# Patient Record
Sex: Female | Born: 1975 | Race: White | Hispanic: No | Marital: Married | Smoking: Current every day smoker
Health system: Southern US, Community
[De-identification: ages and names within clinical notes are randomized; demographics above are authoritative.]

## PROBLEM LIST (undated history)

## (undated) DIAGNOSIS — N926 Irregular menstruation, unspecified: Secondary | ICD-10-CM

## (undated) DIAGNOSIS — D251 Intramural leiomyoma of uterus: Secondary | ICD-10-CM

## (undated) DIAGNOSIS — S73191A Other sprain of right hip, initial encounter: Secondary | ICD-10-CM

## (undated) DIAGNOSIS — F419 Anxiety disorder, unspecified: Secondary | ICD-10-CM

## (undated) DIAGNOSIS — M1611 Unilateral primary osteoarthritis, right hip: Secondary | ICD-10-CM

## (undated) DIAGNOSIS — M1990 Unspecified osteoarthritis, unspecified site: Secondary | ICD-10-CM

## (undated) DIAGNOSIS — J411 Mucopurulent chronic bronchitis: Secondary | ICD-10-CM

## (undated) DIAGNOSIS — R911 Solitary pulmonary nodule: Secondary | ICD-10-CM

## (undated) DIAGNOSIS — D47Z2 Castleman disease: Secondary | ICD-10-CM

## (undated) DIAGNOSIS — G609 Hereditary and idiopathic neuropathy, unspecified: Secondary | ICD-10-CM

## (undated) DIAGNOSIS — J4489 Other specified chronic obstructive pulmonary disease: Secondary | ICD-10-CM

## (undated) DIAGNOSIS — G709 Myoneural disorder, unspecified: Secondary | ICD-10-CM

## (undated) HISTORY — PX: LYMPH NODE BIOPSY: SHX201

## (undated) HISTORY — PX: LYMPH GLAND EXCISION: SHX13

## (undated) HISTORY — PX: TUBAL LIGATION: SHX77

## (undated) HISTORY — PX: TYMPANOSTOMY TUBE PLACEMENT: SHX32

---
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
## 2017-03-27 ENCOUNTER — Emergency Department
Admission: EM | Admit: 2017-03-27 | Discharge: 2017-03-27 | Disposition: A | Discharge status: Home / Self Care | Payer: BLUE CROSS/BLUE SHIELD | Attending: Emergency Medicine | Admitting: Emergency Medicine

## 2017-03-27 ENCOUNTER — Encounter: Payer: Self-pay | Admitting: Emergency Medicine

## 2017-03-27 ENCOUNTER — Emergency Department: Payer: BLUE CROSS/BLUE SHIELD

## 2017-03-27 DIAGNOSIS — F17200 Nicotine dependence, unspecified, uncomplicated: Secondary | ICD-10-CM | POA: Insufficient documentation

## 2017-03-27 DIAGNOSIS — R079 Chest pain, unspecified: Secondary | ICD-10-CM | POA: Diagnosis not present

## 2017-03-27 HISTORY — DX: Castleman disease: D47.Z2

## 2017-03-27 LAB — CBC
HEMATOCRIT: 39.5 % (ref 35.0–47.0)
HEMOGLOBIN: 13.6 g/dL (ref 12.0–16.0)
MCH: 30.4 pg (ref 26.0–34.0)
MCHC: 34.4 g/dL (ref 32.0–36.0)
MCV: 88.4 fL (ref 80.0–100.0)
Platelets: 301 10*3/uL (ref 150–440)
RBC: 4.47 MIL/uL (ref 3.80–5.20)
RDW: 13.9 % (ref 11.5–14.5)
WBC: 10.1 10*3/uL (ref 3.6–11.0)

## 2017-03-27 LAB — BASIC METABOLIC PANEL
ANION GAP: 7 (ref 5–15)
BUN: 7 mg/dL (ref 6–20)
CALCIUM: 8.6 mg/dL — AB (ref 8.9–10.3)
CO2: 23 mmol/L (ref 22–32)
Chloride: 107 mmol/L (ref 101–111)
Creatinine, Ser: 0.62 mg/dL (ref 0.44–1.00)
GFR calc non Af Amer: >60 mL/min (ref >60)
Glucose, Bld: 91 mg/dL (ref 65–99)
POTASSIUM: 3.9 mmol/L (ref 3.5–5.1)
Sodium: 137 mmol/L (ref 135–145)

## 2017-03-27 LAB — TROPONIN I

## 2017-03-27 MED ORDER — TRAMADOL HCL 50 MG PO TABS: 50.0000 mg | ORAL_TABLET | Freq: Four times a day (QID) | ORAL | 0 refills | Status: DC | PRN

## 2017-03-27 NOTE — ED Provider Notes (Signed)
[facility] Emergency Department Provider Note  Time seen: 5:43 PM  I have reviewed the triage vital signs and the nursing notes.   HISTORY  Chief Complaint Chest Pain    HPI Brooke Molina is a 41 y.o. female With no significant past medical history presents to the emergency department for chest pain/back pain. According to the patient for the past 3 days she has been experiencing left scapula pain. States the pain is worse with movement. Today she felt like the pain went into her left chest which concerned her. States she was having some mild shortness of breath at times, but denies any shortness of breath currently. Denies any diaphoresis or nausea at any point. Patient states the pain is gone at this time but only comes intermittently especially with movement. Denies any leg pain or swelling.  Past Medical History:  Diagnosis Date  . 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   Past Surgical History:  Procedure Laterality Date  . TUBAL LIGATION      Prior to Admission medications   Not on File    No Known Allergies  History reviewed. No pertinent family history.  Social History Social History  Substance Use Topics  . Smoking status: Current Every Day Smoker  . Smokeless tobacco: Never Used  . Alcohol use No    Review of Systems Constitutional: Negative for fever. Cardiovascular: left chest pain, now resolved Respiratory: mild shortness breath earlier, now resolved Gastrointestinal: Negative for abdominal pain, vomiting and diarrhea. Musculoskeletal: negative for leg pain or swelling All other ROS negative  ____________________________________________   PHYSICAL EXAM:  VITAL SIGNS: ED Triage Vitals [03/27/17 1633]  Enc Vitals Group     BP (!) 145/79     Pulse Rate 84     Resp 18     Temp 98.4 F (36.9 C)     Temp Source Oral     SpO2 99 %     Weight 235 lb (106.6 kg)   Height 5\' 10"  (1.778 m)     Head Circumference      Peak Flow      Pain Score 4     Pain Loc      Pain Edu?      Excl. in [place]?     Constitutional: Alert and oriented. Well appearing and in no distress. Eyes: Normal exam ENT   Head: Normocephalic and atraumatic.   Mouth/Throat: Mucous membranes are moist. Cardiovascular: Normal rate, regular rhythm. No murmur Respiratory: Normal respiratory effort without tachypnea nor retractions. Breath sounds are clear. No chest or back tenderness to palpation. Gastrointestinal: Soft and nontender. No distention. Musculoskeletal: Nontender with normal range of motion in all extremities. No lower extremity tenderness or edema. Neurologic:  Normal speech and language. No gross focal neurologic deficits Skin:  Skin is warm, dry and intact.  Psychiatric: Mood and affect are normal.   ____________________________________________    EKG  EKG reviewed and interpreted by myself shows normal sinus rhythm at 65 bpm, narrow QRS, normal axis, normal intervals, no concerning ST changes. Normal EKG.  ____________________________________________    RADIOLOGY  chest x-ray is normal  ____________________________________________   INITIAL IMPRESSION / ASSESSMENT AND PLAN / ED COURSE  Pertinent labs & imaging results that were available during my care of the patient were reviewed by me and considered in my medical decision making (see chart for details).  patient presents the emergency department for left back  pain as well as left chest pain. States the back pain has been ongoing for several days but today was the first day that involved the chest. Currently she denies any discomfort or pain at this time. But states the pain is intermittent. Denies any pleuritic nature to the chest pain. At this time differential would be fairly broad but would include musculoskeletal pain, pleurisy, ACS, PE. Patient's labs are within normal limits including negative  troponin. Chest x-ray is normal. Patient is perc negative.  overall she appears very well. We will monitor in the emergency department. As long as the patient continues to appear as well we will discharge home with PCP follow-up.  patient's labs are negative including negative troponin. Chest x-ray is normal. X-rays reassuring. Patient continues to appear very well with no further symptoms in the emergency department. We will discharge with a short course of tramadol. I recommended PCP follow-up we will refer to [facility] clinic. Patient is agreeable to this plan. Provided my normal chest pain return precautions. ____________________________________________   FINAL CLINICAL IMPRESSION(S) / ED DIAGNOSES  chest pain    Harvest Dark, MD 03/27/17 [phone]

## 2017-03-27 NOTE — Discharge Instructions (Signed)
You have been seen in the emergency department today for chest pain. Your workup has shown normal results. As we discussed please follow-up with your primary care physician in the next 1-2 days for recheck. Return to the emergency department for any further chest pain, trouble breathing, or any other symptom personally concerning to yourself. °

## 2017-03-27 NOTE — ED Triage Notes (Signed)
Pt c/o pain under left shoulder blade that radiates around to chest. Has been intermittent since Saturday.  Hard to take deep breath at times but denies [facility] or other symptoms.  Skin warm and dry. respiration unlabored.  VSS

## 2017-10-02 ENCOUNTER — Inpatient Hospital Stay: Payer: BLUE CROSS/BLUE SHIELD | Attending: Oncology | Admitting: Oncology

## 2017-10-02 ENCOUNTER — Inpatient Hospital Stay: Payer: BLUE CROSS/BLUE SHIELD

## 2017-10-02 ENCOUNTER — Other Ambulatory Visit: Payer: Self-pay

## 2017-10-02 ENCOUNTER — Encounter: Payer: Self-pay | Admitting: Oncology

## 2017-10-02 VITALS — BP 143/88 | HR 73 | Temp 97.7°F | Wt 225.1 lb

## 2017-10-02 DIAGNOSIS — Z720 Tobacco use: Secondary | ICD-10-CM | POA: Insufficient documentation

## 2017-10-02 DIAGNOSIS — D47Z2 Castleman disease: Secondary | ICD-10-CM

## 2017-10-02 DIAGNOSIS — R634 Abnormal weight loss: Secondary | ICD-10-CM | POA: Insufficient documentation

## 2017-10-02 LAB — COMPREHENSIVE METABOLIC PANEL
ALBUMIN: 4.1 g/dL (ref 3.5–5.0)
ALT: 13 U/L — ABNORMAL LOW (ref 14–54)
ANION GAP: 7 (ref 5–15)
AST: 15 U/L (ref 15–41)
Alkaline Phosphatase: 61 U/L (ref 38–126)
BILIRUBIN TOTAL: 0.9 mg/dL (ref 0.3–1.2)
BUN: 14 mg/dL (ref 6–20)
CHLORIDE: 109 mmol/L (ref 101–111)
CO2: 21 mmol/L — ABNORMAL LOW (ref 22–32)
Calcium: 8.7 mg/dL — ABNORMAL LOW (ref 8.9–10.3)
Creatinine, Ser: 0.67 mg/dL (ref 0.44–1.00)
GFR calc Af Amer: >60 mL/min (ref >60)
GFR calc non Af Amer: >60 mL/min (ref >60)
GLUCOSE: 92 mg/dL (ref 65–99)
POTASSIUM: 3.9 mmol/L (ref 3.5–5.1)
Sodium: 137 mmol/L (ref 135–145)
Total Protein: 7.1 g/dL (ref 6.5–8.1)

## 2017-10-02 LAB — CBC WITH DIFFERENTIAL/PLATELET
BASOS ABS: 0.1 10*3/uL (ref 0–0.1)
Basophils Relative: 1 %
Eosinophils Absolute: 0.2 10*3/uL (ref 0–0.7)
Eosinophils Relative: 2 %
HEMATOCRIT: 41.8 % (ref 35.0–47.0)
Hemoglobin: 14.3 g/dL (ref 12.0–16.0)
LYMPHS ABS: 2.1 10*3/uL (ref 1.0–3.6)
LYMPHS PCT: 27 %
MCH: 30.7 pg (ref 26.0–34.0)
MCHC: 34.3 g/dL (ref 32.0–36.0)
MCV: 89.6 fL (ref 80.0–100.0)
MONO ABS: 0.4 10*3/uL (ref 0.2–0.9)
Monocytes Relative: 5 %
NEUTROS ABS: 5.2 10*3/uL (ref 1.4–6.5)
Neutrophils Relative %: 65 %
Platelets: 290 10*3/uL (ref 150–440)
RBC: 4.66 MIL/uL (ref 3.80–5.20)
RDW: 13.8 % (ref 11.5–14.5)
WBC: 8 10*3/uL (ref 3.6–11.0)

## 2017-10-02 LAB — SEDIMENTATION RATE: Sed Rate: 5 mm/hr (ref 0–20)

## 2017-10-02 LAB — FERRITIN: Ferritin: 37 ng/mL (ref 11–307)

## 2017-10-02 LAB — C-REACTIVE PROTEIN

## 2017-10-02 LAB — LACTATE DEHYDROGENASE: LDH: 119 U/L (ref 98–192)

## 2017-10-02 NOTE — Progress Notes (Signed)
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   HISTORY OF PRESENTING ILLNESS:  Brooke Molina is a  42 y.o.  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 She reports have left cervical lymphadenopathy associated with weight loss. 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  Patient reports that she was seen by hematologist for 2 times with 6 months interval, and had a few CT surveillance prior to moving to [place] in 2014 [place] 2015.  Since then she has lost follow-up.  She becomes concerned recently as she has been losing weight despite eating well. Review of Systems  Constitutional: Positive for weight loss. Negative for chills, fever and malaise/fatigue.  HENT: Negative for ear discharge and hearing loss.   Eyes: Negative for double vision and photophobia.  Respiratory: Negative for cough and hemoptysis.   Cardiovascular: Negative for chest pain and orthopnea.  Gastrointestinal: Negative for abdominal pain, heartburn, nausea and vomiting.  Genitourinary: Negative for dysuria.  Musculoskeletal: Negative for myalgias.  Skin: Negative for rash.  Neurological: Negative for dizziness and tingling.  Endo/Heme/Allergies: Does not bruise/bleed easily.  Psychiatric/Behavioral: Negative for depression and substance abuse.    MEDICAL HISTORY:  Past Medical History:  Diagnosis Date  . 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 TUBAL LIGATION      SOCIAL HISTORY: Social History   Socioeconomic History  . Marital status: Married    Spouse name: Not on file  .  Number of children: Not on file  . Years of education: Not on file  . Highest education level: Not on file  Occupational History  . Not on file  Social Needs  . Financial resource strain: Not on file  . Food insecurity:    Worry: Not on file    Inability: Not on file  . Transportation needs:    Medical: Not on file    Non-medical: Not on file  Tobacco Use  . Smoking status: Current Every Day Smoker    Packs/day: 1.00    Years: 24.00    Pack years: 24.00    Types: Cigarettes  . Smokeless tobacco: Never Used  Substance and Sexual Activity  . Alcohol use: No  . Drug use: No  . Sexual activity: Not on file  Lifestyle  . Physical activity:    Days per week: Not on file    Minutes per session: Not on file  . Stress: Not on file  Relationships  . Social connections:    Talks on phone: Not on file    Gets together: Not on file    Attends religious service: Not on file    Active member of club or organization: Not on file    Attends meetings of clubs or organizations: Not on file    Relationship status: Not on file  . Intimate partner violence:    Fear of current or ex partner: Not on file    Emotionally abused: Not on file    Physically abused: Not on file    Forced sexual activity: Not on file  Other Topics Concern  . Not on file  Social History Narrative  . Not on file    FAMILY HISTORY:  History reviewed. No pertinent family history.  ALLERGIES:  has No Known Allergies.  MEDICATIONS:  Current Outpatient Medications  Medication Sig Dispense Refill  . naproxen sodium (ALEVE) 220 MG tablet Take by mouth.     No current facility-administered medications for this visit.      PHYSICAL EXAMINATION: ECOG PERFORMANCE STATUS: 1 - Symptomatic but completely ambulatory Vitals:   10/02/17 0909 10/02/17 0934  BP: (!) 143/88 (!) 143/88  Pulse: 73 73  Temp: 97.7 F (36.5 C) 97.7 F (36.5 C)   Filed Weights   10/02/17 0909 10/02/17 0934  Weight: 225 lb 1.6 oz (102.1  kg) 225 lb 1.6 oz (102.1 kg)    Physical Exam  Constitutional: She is oriented to person, place, and time and well-developed, well-nourished, and in no distress. No distress.  HENT:  Head: Normocephalic and atraumatic.  Mouth/Throat: No oropharyngeal exudate.  Eyes: Pupils are equal, round, and reactive to light. Conjunctivae and EOM are normal. No scleral icterus.  Neck: Normal range of motion. Neck supple.  A very faint scar from previous excisional lymph node biopsy  Cardiovascular: Normal rate and regular rhythm.  No murmur heard. Pulmonary/Chest: Effort normal and breath sounds normal.  Abdominal: Soft. Bowel sounds are normal.  Musculoskeletal: Normal range of motion. She exhibits no edema.  Lymphadenopathy:    She has no cervical adenopathy.  Neurological: She is alert and oriented to person, place, and time. No cranial nerve deficit.  Skin: Skin is warm.  Psychiatric: Affect and judgment normal.     LABORATORY DATA:  I have reviewed the data as listed Lab Results  Component Value Date   WBC 8.0 10/02/2017   HGB 14.3 10/02/2017   HCT 41.8 10/02/2017   MCV 89.6 10/02/2017   PLT 290 10/02/2017   Recent Labs    03/27/17 1637 10/02/17 1105  NA 137 137  K 3.9 3.9  CL 107 109  CO2 23 21*  GLUCOSE 91 92  BUN 7 14  CREATININE 0.62 0.67  CALCIUM 8.6* 8.7*  GFRNONAA >60 >60  GFRAA >60 >60  PROT  --  7.1  ALBUMIN  --  4.1  AST  --  15  ALT  --  13*  ALKPHOS  --  61  BILITOT  --  0.9       ASSESSMENT & PLAN:  1. Castleman disease [facility])    Discussed with patient that we will need to obtain medical records and decide what additional work up she needs at this point. Patient voices understanding.  Check baseline CBC and CMP.   All questions were answered. The patient knows to call the clinic with any problems questions or concerns.  Return of visit: 2 weeks.  Thank you for this kind referral and the opportunity to participate in the care of this patient. A  copy of today's note is routed to referring provider    Earlie Server, MD, PhD Hematology Oncology [facility] at [facility] Pager- [phone] 10/02/2017

## 2017-10-03 LAB — PROTEIN ELECTROPHORESIS, SERUM
A/G Ratio: 1.3 (ref 0.7–1.7)
ALBUMIN ELP: 3.8 g/dL (ref 2.9–4.4)
ALPHA-1-GLOBULIN: 0.2 g/dL (ref 0.0–0.4)
Alpha-2-Globulin: 0.7 g/dL (ref 0.4–1.0)
BETA GLOBULIN: 0.9 g/dL (ref 0.7–1.3)
Gamma Globulin: 1.1 g/dL (ref 0.4–1.8)
Globulin, Total: 2.9 g/dL (ref 2.2–3.9)
TOTAL PROTEIN ELP: 6.7 g/dL (ref 6.0–8.5)

## 2017-10-03 LAB — HIV ANTIBODY (ROUTINE TESTING W REFLEX): HIV Screen 4th Generation wRfx: NONREACTIVE

## 2017-10-03 LAB — HEPATITIS PANEL, ACUTE
HEP A IGM: NEGATIVE
Hep B C IgM: NEGATIVE
Hepatitis B Surface Ag: NEGATIVE

## 2017-10-04 LAB — COMP PANEL: LEUKEMIA/LYMPHOMA

## 2017-10-11 ENCOUNTER — Encounter: Payer: Self-pay | Admitting: Oncology

## 2017-10-16 ENCOUNTER — Ambulatory Visit: Payer: BLUE CROSS/BLUE SHIELD | Admitting: Oncology

## 2017-10-17 ENCOUNTER — Inpatient Hospital Stay: Payer: BLUE CROSS/BLUE SHIELD | Admitting: Oncology

## 2017-10-17 ENCOUNTER — Inpatient Hospital Stay: Payer: BLUE CROSS/BLUE SHIELD

## 2017-10-17 VITALS — BP 140/82 | Temp 98.0°F | Wt 226.0 lb

## 2017-10-17 DIAGNOSIS — D47Z2 Castleman disease: Secondary | ICD-10-CM

## 2017-10-17 DIAGNOSIS — Z720 Tobacco use: Secondary | ICD-10-CM | POA: Diagnosis not present

## 2017-10-17 LAB — FIBRINOGEN: Fibrinogen: 329 mg/dL (ref 210–475)

## 2017-10-18 ENCOUNTER — Other Ambulatory Visit: Payer: Self-pay | Admitting: Obstetrics and Gynecology

## 2017-10-18 DIAGNOSIS — Z1239 Encounter for other screening for malignant neoplasm of breast: Secondary | ICD-10-CM

## 2017-10-18 LAB — ANTINUCLEAR ANTIBODIES, IFA: ANA Ab, IFA: NEGATIVE

## 2017-10-18 LAB — KAPPA/LAMBDA LIGHT CHAINS
KAPPA, LAMDA LIGHT CHAIN RATIO: 1.44 (ref 0.26–1.65)
Kappa free light chain: 20.7 mg/L — ABNORMAL HIGH (ref 3.3–19.4)
LAMDA FREE LIGHT CHAINS: 14.4 mg/L (ref 5.7–26.3)

## 2017-10-18 LAB — RHEUMATOID FACTOR: Rhuematoid fact SerPl-aCnc: <10 IU/mL (ref 0.0–13.9)

## 2017-10-20 NOTE — Progress Notes (Signed)
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   HISTORY OF PRESENTING ILLNESS:  Brooke Molina is a  42 y.o.  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 She reports have left cervical lymphadenopathy associated with weight loss. 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  Patient reports that she was seen by hematologist for 2 times with 6 months interval, and had a few CT surveillance prior to moving to [place] in 2014 oh 2015.  Since then she has lost follow-up.  She becomes concerned recently as she has been losing weight despite eating well.  INTERVAL HISTORY Brooke Molina is a 42 y.o. female who has above history reviewed by me today presents for follow up visit for management of castleman's syndrome.  Reviewed medical records from previous oncologist. She was diagnosed there and was having CT image survailence until she moves to [place] a few years ago.   Problems and complaints are listed below: Review of Systems  Constitutional: Positive for weight loss. Negative for chills, fever and malaise/fatigue.  HENT: Negative for ear discharge and hearing loss.   Eyes: Negative for double vision and photophobia.  Respiratory: Negative for cough and hemoptysis.   Cardiovascular: Negative for chest pain and orthopnea.  Gastrointestinal: Negative for abdominal pain, heartburn, nausea and vomiting.  Genitourinary: Negative for dysuria.  Musculoskeletal: Negative for myalgias.  Skin: Negative for rash.  Neurological: Negative for dizziness and tingling.  Endo/Heme/Allergies: Does not bruise/bleed easily.    Psychiatric/Behavioral: Negative for depression and substance abuse.    MEDICAL HISTORY:  Past Medical History:  Diagnosis Date  . 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 TUBAL LIGATION      SOCIAL HISTORY: Social History   Socioeconomic History  . Marital status: Married    Spouse name: Not on file  . Number of children: Not on file  . Years of education: Not on file  . Highest education level: Not on file  Occupational History  . Not on file  Social Needs  . Financial resource strain: Not on file  . Food insecurity:    Worry: Not on file    Inability: Not on file  . Transportation needs:    Medical: Not on file    Non-medical: Not on file  Tobacco Use  . Smoking status: Current Every Day Smoker    Packs/day: 1.00    Years: 24.00    Pack years: 24.00    Types: Cigarettes  . Smokeless tobacco: Never Used  Substance and Sexual Activity  . Alcohol use: No  . Drug use: No  . Sexual activity: Not on file  Lifestyle  . Physical activity:    Days per week: Not on file    Minutes per session: Not on file  . Stress: Not on file  Relationships  . Social connections:    Talks on phone: Not on file    Gets together: Not on file    Attends religious service: Not on file    Active member of club or organization: Not on file    Attends meetings of clubs or organizations: Not on file    Relationship status: Not on file  .  Intimate partner violence:    Fear of current or ex partner: Not on file    Emotionally abused: Not on file    Physically abused: Not on file    Forced sexual activity: Not on file  Other Topics Concern  . Not on file  Social History Narrative  . Not on file    FAMILY HISTORY: No family history on file.  ALLERGIES:  has No Known Allergies.  MEDICATIONS:  Current Outpatient Medications  Medication Sig Dispense Refill  . naproxen sodium (ALEVE) 220 MG tablet Take by mouth.      No current facility-administered medications for this visit.      PHYSICAL EXAMINATION: ECOG PERFORMANCE STATUS: 1 - Symptomatic but completely ambulatory Vital Sign: see chart.   Physical Exam  Constitutional: She is oriented to person, place, and time and well-developed, well-nourished, and in no distress. No distress.  HENT:  Head: Normocephalic and atraumatic.  Mouth/Throat: No oropharyngeal exudate.  Eyes: Pupils are equal, round, and reactive to light. Conjunctivae and EOM are normal. No scleral icterus.  Neck: Normal range of motion. Neck supple. No JVD present.  A very faint scar from previous excisional lymph node biopsy  Cardiovascular: Normal rate and regular rhythm.  No murmur heard. Pulmonary/Chest: Effort normal and breath sounds normal. No respiratory distress. She has no wheezes.  Abdominal: Soft. Bowel sounds are normal. She exhibits no distension. There is no tenderness. There is no rebound.  Musculoskeletal: Normal range of motion. She exhibits no edema.  Lymphadenopathy:    She has no cervical adenopathy.  Neurological: She is alert and oriented to person, place, and time. No cranial nerve deficit. Coordination normal.  Skin: Skin is warm. No rash noted. No erythema.  Psychiatric: Affect and judgment normal.     LABORATORY DATA:  I have reviewed the data as listed Lab Results  Component Value Date   WBC 8.0 10/02/2017   HGB 14.3 10/02/2017   HCT 41.8 10/02/2017   MCV 89.6 10/02/2017   PLT 290 10/02/2017   Recent Labs    03/27/17 1637 10/02/17 1105  NA 137 137  K 3.9 3.9  CL 107 109  CO2 23 21*  GLUCOSE 91 92  BUN 7 14  CREATININE 0.62 0.67  CALCIUM 8.6* 8.7*  GFRNONAA >60 >60  GFRAA >60 >60  PROT  --  7.1  ALBUMIN  --  4.1  AST  --  15  ALT  --  13*  ALKPHOS  --  61  BILITOT  --  0.9   ESR 5 CRP <0.8 Peripheral blood Flowcytometry negative.  LDH 119. Ferritin 37 HIV negative Hepatitis panel negative.    ASSESSMENT & PLAN:   1. 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 Will check RF, ANA, light chain ratio.   She has not had any image study recently. Will obtain CT neck chest abdomen pelvis.   All questions were answered. The patient knows to call the clinic with any problems questions or concerns.  Return of visit: 2 weeks to discuss results.     Earlie Server, MD, PhD Hematology Oncology [facility] at [facility] Pager- [phone]

## 2017-10-24 ENCOUNTER — Ambulatory Visit
Admission: RE | Admit: 2017-10-24 | Discharge: 2017-10-24 | Disposition: A | Discharge status: Home / Self Care | Payer: BLUE CROSS/BLUE SHIELD | Source: Ambulatory Visit | Attending: Oncology | Admitting: Oncology

## 2017-10-24 ENCOUNTER — Encounter: Payer: Self-pay | Admitting: Oncology

## 2017-10-24 DIAGNOSIS — D259 Leiomyoma of uterus, unspecified: Secondary | ICD-10-CM | POA: Diagnosis not present

## 2017-10-24 DIAGNOSIS — M5137 Other intervertebral disc degeneration, lumbosacral region: Secondary | ICD-10-CM | POA: Insufficient documentation

## 2017-10-24 DIAGNOSIS — D47Z2 Castleman disease: Secondary | ICD-10-CM | POA: Insufficient documentation

## 2017-10-24 DIAGNOSIS — R590 Localized enlarged lymph nodes: Secondary | ICD-10-CM | POA: Diagnosis not present

## 2017-10-24 DIAGNOSIS — K449 Diaphragmatic hernia without obstruction or gangrene: Secondary | ICD-10-CM | POA: Insufficient documentation

## 2017-10-24 DIAGNOSIS — M47814 Spondylosis without myelopathy or radiculopathy, thoracic region: Secondary | ICD-10-CM | POA: Diagnosis not present

## 2017-10-24 MED ORDER — IOPAMIDOL (ISOVUE-300) INJECTION 61%
100.0000 mL | Freq: Once | INTRAVENOUS | Status: AC | PRN
  Administered 2017-10-24: 100 mL via INTRAVENOUS

## 2017-10-26 ENCOUNTER — Encounter: Payer: Self-pay | Admitting: Oncology

## 2017-10-31 ENCOUNTER — Other Ambulatory Visit: Payer: Self-pay

## 2017-10-31 ENCOUNTER — Encounter: Payer: Self-pay | Admitting: Oncology

## 2017-10-31 ENCOUNTER — Inpatient Hospital Stay: Payer: BLUE CROSS/BLUE SHIELD | Attending: Oncology | Admitting: Oncology

## 2017-10-31 VITALS — BP 130/82 | HR 61 | Temp 96.9°F | Resp 18 | Wt 225.4 lb

## 2017-10-31 DIAGNOSIS — R634 Abnormal weight loss: Secondary | ICD-10-CM | POA: Insufficient documentation

## 2017-10-31 DIAGNOSIS — D47Z2 Castleman disease: Secondary | ICD-10-CM | POA: Diagnosis not present

## 2017-10-31 DIAGNOSIS — R5382 Chronic fatigue, unspecified: Secondary | ICD-10-CM | POA: Diagnosis not present

## 2017-10-31 NOTE — Progress Notes (Signed)
Patient here for follow-up. No complaints or concerns.

## 2017-10-31 NOTE — Progress Notes (Signed)
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   HISTORY OF PRESENTING ILLNESS:  Brooke Molina is a  42 y.o.  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 She reports have left cervical lymphadenopathy associated with weight loss. 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  Patient reports that she was seen by hematologist for 2 times with 6 months interval, and had a few CT surveillance prior to moving to [place] in 2014 [place] 2015.  Since then she has lost follow-up.  She becomes concerned recently as she has been losing weight despite eating well.  Reviewed medical records from previous oncologist. She was diagnosed there and was having CT image survailence until she moves to [place] a few years ago.  INTERVAL HISTORY Mercede Molina is a 42 y.o. female who has above history reviewed by me today presents for follow up visit for management of castleman's syndrome. During interval, she has had CT chest abdomen pelvis done. Her previous cervical lymph node biopsy pathology was not available and patient provides additional information. Biopsy was done at [place].  Today she feels at baseline. Continue to have chronic fatigue. Weight is stable since last visit.   Review of Systems  Constitutional: Positive for malaise/fatigue. Negative for chills, fever and weight loss.  HENT: Negative for congestion, ear discharge, ear pain, hearing loss, nosebleeds, sinus pain and sore throat.   Eyes: Negative for double vision, photophobia, pain, discharge and redness.  Respiratory: Negative for cough,  hemoptysis, sputum production, shortness of breath and wheezing.   Cardiovascular: Negative for chest pain, palpitations, orthopnea, claudication and leg swelling.  Gastrointestinal: Negative for abdominal pain, blood in stool, constipation, diarrhea, heartburn, melena, nausea and vomiting.  Genitourinary: Negative for dysuria, flank pain, frequency and hematuria.  Musculoskeletal: Negative for back pain, myalgias and neck pain.  Skin: Negative for itching and rash.  Neurological: Negative for dizziness, tingling, tremors, focal weakness, weakness and headaches.  Endo/Heme/Allergies: Negative for environmental allergies. Does not bruise/bleed easily.  Psychiatric/Behavioral: Negative for depression, hallucinations, substance abuse and suicidal ideas. The patient is not nervous/anxious.     MEDICAL HISTORY:  Past Medical History:  Diagnosis Date  . 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 TUBAL LIGATION      SOCIAL HISTORY: Social History   Socioeconomic History  . Marital status: Married    Spouse name: Not on file  . Number of children: Not on file  . Years of education: Not on file  . Highest education level: Not on file  Occupational History  . Not on file  Social Needs  . Financial resource strain: Not on file  . Food insecurity:    Worry: Not on file    Inability: Not on file  . Transportation needs:    Medical: Not on file    Non-medical: Not on file  Tobacco Use  . Smoking status: Current Every Day Smoker    Packs/day: 1.00    Years: 24.00    Pack years: 24.00    Types: Cigarettes  . Smokeless tobacco: Never Used  Substance and Sexual Activity  . Alcohol use: No  . Drug use: No  .  Sexual activity: Not on file  Lifestyle  . Physical activity:    Days per week: Not on file    Minutes per session: Not on file  . Stress: Not on file  Relationships  . Social connections:    Talks on phone: Not on  file    Gets together: Not on file    Attends religious service: Not on file    Active member of club or organization: Not on file    Attends meetings of clubs or organizations: Not on file    Relationship status: Not on file  . Intimate partner violence:    Fear of current or ex partner: Not on file    Emotionally abused: Not on file    Physically abused: Not on file    Forced sexual activity: Not on file  Other Topics Concern  . Not on file  Social History Narrative  . Not on file    FAMILY HISTORY: History reviewed. No pertinent family history.  ALLERGIES:  has No Known Allergies.  MEDICATIONS:  Current Outpatient Medications  Medication Sig Dispense Refill  . naproxen sodium (ALEVE) 220 MG tablet Take by mouth.     No current facility-administered medications for this visit.      PHYSICAL EXAMINATION: ECOG PERFORMANCE STATUS: 1 - Symptomatic but completely ambulatory Vitals:   10/31/17 0948  BP: 130/82  Pulse: 61  Resp: 18  Temp: (!) 96.9 F (36.1 C)   Filed Weights   10/31/17 0948  Weight: 225 lb 6.4 oz (102.2 kg)   Physical Exam  Constitutional: She is oriented to person, place, and time and well-developed, well-nourished, and in no distress. No distress.  HENT:  Head: Normocephalic and atraumatic.  Mouth/Throat: No oropharyngeal exudate.  Eyes: Pupils are equal, round, and reactive to light. Conjunctivae and EOM are normal. No scleral icterus.  Neck: Normal range of motion. Neck supple. No JVD present.  A very faint scar from previous excisional lymph node biopsy  Cardiovascular: Normal rate and regular rhythm.  No murmur heard. Pulmonary/Chest: Effort normal and breath sounds normal. No respiratory distress. She has no wheezes.  Abdominal: Soft. Bowel sounds are normal. She exhibits no distension. There is no tenderness. There is no rebound.  Musculoskeletal: Normal range of motion. She exhibits no edema.  Lymphadenopathy:    She has no cervical  adenopathy.  Neurological: She is alert and oriented to person, place, and time. No cranial nerve deficit. Coordination normal.  Skin: Skin is warm. No rash noted. No erythema.  Psychiatric: Affect and judgment normal.     LABORATORY DATA:  I have reviewed the data as listed Lab Results  Component Value Date   WBC 8.0 10/02/2017   HGB 14.3 10/02/2017   HCT 41.8 10/02/2017   MCV 89.6 10/02/2017   PLT 290 10/02/2017   Recent Labs    03/27/17 1637 10/02/17 1105  NA 137 137  K 3.9 3.9  CL 107 109  CO2 23 21*  GLUCOSE 91 92  BUN 7 14  CREATININE 0.62 0.67  CALCIUM 8.6* 8.7*  GFRNONAA >60 >60  GFRAA >60 >60  PROT  --  7.1  ALBUMIN  --  4.1  AST  --  15  ALT  --  13*  ALKPHOS  --  61  BILITOT  --  0.9   ESR 5 CRP <0.8 Peripheral blood Flowcytometry negative.  LDH 119. Ferritin 37 HIV negative Hepatitis panel negative.  RADIOGRAPHIC STUDIES: I have personally reviewed the radiological images  as listed and agreed with the findings in the report. 10/24/2017  CT chest abdomen pelvis with contrast 1. Mild right paratracheal and right hilar adenopathy in the chest. No other overt adenopathy is identified. No organomegaly or calcified adenopathy. No appreciable mesenteric mass or ascites. 2. 3 by 4 mm right peribronchovascular nodule on image 28/3. No follow-up needed if patient is low-risk. Non-contrast chest CT can be considered in 12 months if patient is high-risk. This recommendation follows the consensus statement: Guidelines for Management of Incidental Pulmonary Nodules Detected on CT Images: From the Fleischner Society 2017; Radiology 2017; 284:228-243. 3. Other imaging findings of potential clinical significance: Small type 1 hiatal hernia. Spondylosis and degenerative disc disease cause mild left foraminal impingement at L5-S1. Uterine fibroids. Enhancing lesion of the left ovary is most likely a corpus luteum   ASSESSMENT & PLAN:  1. 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 Chronic fatigue     labs were reviewed and discussed with patient.  RF, ANA, light chain ratio, ferritin, CRP, ESR, fibrinogen all within normal limits.  CT scan was independently reviewed by me and I discussed with patient in details.  CT showed very mild lymphadenopathy in the right paratracheal and right hilar area.  She has minimal symptoms except chronic fatigue and self-reported weight loss.  Her weight has been stable for the past 4 weeks.  All the inflammatory markers are negative.  I recommend watchful waiting. Meanwhile we will try to obtain her lymph node excisional biopsy pathology reports. Given that we do not have  any previous CT scan to compare with, plan repeat CT scan neck/chest/abdominal pelvis in 6 months.  All questions were answered. The patient knows to call the clinic with any problems questions or concerns.  Return of visit: 6 months to discuss follow-up CT scan.  Earlie Server, MD, PhD Hematology Oncology [facility] at [facility] Pager- [phone] 10/31/17

## 2017-11-01 ENCOUNTER — Encounter: Payer: Self-pay | Admitting: Oncology

## 2017-11-08 ENCOUNTER — Ambulatory Visit
Admission: RE | Admit: 2017-11-08 | Discharge: 2017-11-08 | Disposition: A | Discharge status: Home / Self Care | Payer: BLUE CROSS/BLUE SHIELD | Source: Ambulatory Visit | Attending: Obstetrics and Gynecology | Admitting: Obstetrics and Gynecology

## 2017-11-08 DIAGNOSIS — Z1231 Encounter for screening mammogram for malignant neoplasm of breast: Secondary | ICD-10-CM | POA: Diagnosis not present

## 2017-11-08 DIAGNOSIS — Z1239 Encounter for other screening for malignant neoplasm of breast: Secondary | ICD-10-CM

## 2017-11-11 ENCOUNTER — Encounter: Payer: Self-pay | Admitting: Oncology

## 2017-11-16 ENCOUNTER — Other Ambulatory Visit: Payer: Self-pay | Admitting: *Deleted

## 2017-11-16 ENCOUNTER — Telehealth: Payer: Self-pay | Admitting: *Deleted

## 2017-11-16 DIAGNOSIS — D47Z2 Castleman disease: Secondary | ICD-10-CM

## 2017-11-16 NOTE — Telephone Encounter (Signed)
Called and made patient aware that her Neck CT was scheduled For 11/22/17 @10 :00. She is aware of date and time.

## 2017-11-22 ENCOUNTER — Ambulatory Visit
Admission: RE | Admit: 2017-11-22 | Discharge: 2017-11-22 | Disposition: A | Discharge status: Home / Self Care | Payer: BLUE CROSS/BLUE SHIELD | Source: Ambulatory Visit | Attending: Oncology | Admitting: Oncology

## 2017-11-22 DIAGNOSIS — R590 Localized enlarged lymph nodes: Secondary | ICD-10-CM | POA: Diagnosis not present

## 2017-11-22 DIAGNOSIS — D47Z2 Castleman disease: Secondary | ICD-10-CM | POA: Insufficient documentation

## 2017-11-22 MED ORDER — IOPAMIDOL (ISOVUE-300) INJECTION 61%
75.0000 mL | Freq: Once | INTRAVENOUS | Status: AC | PRN
  Administered 2017-11-22: 75 mL via INTRAVENOUS

## 2018-04-08 ENCOUNTER — Other Ambulatory Visit: Payer: Self-pay | Admitting: Orthopedic Surgery

## 2018-04-08 DIAGNOSIS — M25562 Pain in left knee: Secondary | ICD-10-CM

## 2018-04-11 ENCOUNTER — Ambulatory Visit
Admission: RE | Admit: 2018-04-11 | Discharge: 2018-04-11 | Disposition: A | Discharge status: Home / Self Care | Payer: BLUE CROSS/BLUE SHIELD | Source: Ambulatory Visit | Attending: Orthopedic Surgery | Admitting: Orthopedic Surgery

## 2018-04-11 DIAGNOSIS — M67462 Ganglion, left knee: Secondary | ICD-10-CM | POA: Diagnosis not present

## 2018-04-11 DIAGNOSIS — M659 Synovitis and tenosynovitis, unspecified: Secondary | ICD-10-CM | POA: Insufficient documentation

## 2018-04-11 DIAGNOSIS — M1712 Unilateral primary osteoarthritis, left knee: Secondary | ICD-10-CM | POA: Diagnosis not present

## 2018-04-11 DIAGNOSIS — M25562 Pain in left knee: Secondary | ICD-10-CM | POA: Diagnosis not present

## 2018-05-01 ENCOUNTER — Ambulatory Visit
Admission: RE | Admit: 2018-05-01 | Discharge: 2018-05-01 | Disposition: A | Discharge status: Home / Self Care | Payer: BLUE CROSS/BLUE SHIELD | Source: Ambulatory Visit | Attending: Oncology | Admitting: Oncology

## 2018-05-01 DIAGNOSIS — D47Z2 Castleman disease: Secondary | ICD-10-CM | POA: Diagnosis not present

## 2018-05-01 DIAGNOSIS — R590 Localized enlarged lymph nodes: Secondary | ICD-10-CM | POA: Diagnosis not present

## 2018-05-01 MED ORDER — IOPAMIDOL (ISOVUE-300) INJECTION 61%
100.0000 mL | Freq: Once | INTRAVENOUS | Status: AC | PRN
  Administered 2018-05-01: 100 mL via INTRAVENOUS

## 2018-05-07 ENCOUNTER — Ambulatory Visit: Payer: BLUE CROSS/BLUE SHIELD | Admitting: Oncology

## 2018-05-08 ENCOUNTER — Inpatient Hospital Stay: Payer: BLUE CROSS/BLUE SHIELD | Attending: Oncology | Admitting: Oncology

## 2018-05-08 ENCOUNTER — Other Ambulatory Visit: Payer: Self-pay

## 2018-05-08 ENCOUNTER — Encounter: Payer: Self-pay | Admitting: Oncology

## 2018-05-08 VITALS — BP 121/83 | HR 64 | Temp 97.6°F | Resp 18 | Wt 218.5 lb

## 2018-05-08 DIAGNOSIS — R599 Enlarged lymph nodes, unspecified: Secondary | ICD-10-CM | POA: Diagnosis not present

## 2018-05-08 DIAGNOSIS — Z720 Tobacco use: Secondary | ICD-10-CM | POA: Diagnosis not present

## 2018-05-08 DIAGNOSIS — R634 Abnormal weight loss: Secondary | ICD-10-CM | POA: Diagnosis not present

## 2018-05-08 DIAGNOSIS — R5382 Chronic fatigue, unspecified: Secondary | ICD-10-CM

## 2018-05-08 DIAGNOSIS — D47Z2 Castleman disease: Secondary | ICD-10-CM

## 2018-05-08 NOTE — Progress Notes (Signed)
Patent here for follow up. Has been having diarrhea for the past week.

## 2018-05-09 NOTE — Progress Notes (Signed)
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   HISTORY OF PRESENTING ILLNESS:  Brooke Molina is a  42 y.o.  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 She reports have left cervical lymphadenopathy associated with weight loss. 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  Patient reports that she was seen by hematologist for 2 times with 6 months interval, and had a few CT surveillance prior to moving to [place] in 2014 [place] 2015.  Since then she has lost follow-up.  She becomes concerned recently as she has been losing weight despite eating well.  Reviewed medical records from previous oncologist. She was diagnosed there and was having CT image survailence until she moves to [place] a few years ago.   Her previous cervical lymph node biopsy pathology was not available and patient provides additional information. Biopsy was done at [place].   INTERVAL HISTORY Brooke Molina is a 42 y.o. female who has above history reviewed by me today presents for follow up visit for management of presumed.  She reports feeling chronic fatigued. She has lost 6 pounds since last seen.  CT was done prior to today's visit and she presents to discuss results.    Review of Systems  Constitutional: Positive for malaise/fatigue. Negative for chills, fever and weight loss.  HENT: Negative for congestion, ear discharge, ear pain, hearing loss, nosebleeds, sinus pain and sore throat.   Eyes: Negative for double vision, photophobia, pain, discharge and redness.  Respiratory: Negative for cough, hemoptysis,  sputum production, shortness of breath and wheezing.   Cardiovascular: Negative for chest pain, palpitations, orthopnea, claudication and leg swelling.  Gastrointestinal: Negative for abdominal pain, blood in stool, constipation, diarrhea, heartburn, melena, nausea and vomiting.  Genitourinary: Negative for dysuria, flank pain, frequency and hematuria.  Musculoskeletal: Negative for back pain, myalgias and neck pain.  Skin: Negative for itching and rash.  Neurological: Negative for dizziness, tingling, tremors, focal weakness, weakness and headaches.  Endo/Heme/Allergies: Negative for environmental allergies. Does not bruise/bleed easily.  Psychiatric/Behavioral: Negative for depression, hallucinations, substance abuse and suicidal ideas. The patient is not nervous/anxious.     MEDICAL HISTORY:  Past Medical History:  Diagnosis Date  . 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 TUBAL LIGATION      SOCIAL HISTORY: Social History   Socioeconomic History  . Marital status: Married    Spouse name: Not on file  . Number of children: Not on file  . Years of education: Not on file  . Highest education level: Not on file  Occupational History  . Not on file  Social Needs  . Financial resource strain: Not on file  . Food insecurity:    Worry: Not on file    Inability: Not on file  . Transportation needs:    Medical: Not on file    Non-medical: Not on file  Tobacco Use  . Smoking status: Current Every Day Smoker    Packs/day: 1.00    Years: 24.00    Pack years: 24.00    Types: Cigarettes  . Smokeless tobacco: Never Used  Substance and Sexual Activity  . Alcohol use: No  .  Drug use: No  . Sexual activity: Not on file  Lifestyle  . Physical activity:    Days per week: Not on file    Minutes per session: Not on file  . Stress: Not on file  Relationships  . Social connections:    Talks on phone: Not on file    Gets  together: Not on file    Attends religious service: Not on file    Active member of club or organization: Not on file    Attends meetings of clubs or organizations: Not on file    Relationship status: Not on file  . Intimate partner violence:    Fear of current or ex partner: Not on file    Emotionally abused: Not on file    Physically abused: Not on file    Forced sexual activity: Not on file  Other Topics Concern  . Not on file  Social History Narrative  . Not on file    FAMILY HISTORY: Family History  Problem Relation Age of Onset  . Breast cancer Maternal Aunt     ALLERGIES:  has No Known Allergies.  MEDICATIONS:  Current Outpatient Medications  Medication Sig Dispense Refill  . naproxen sodium (ALEVE) 220 MG tablet Take by mouth.     No current facility-administered medications for this visit.      PHYSICAL EXAMINATION: ECOG PERFORMANCE STATUS: 1 - Symptomatic but completely ambulatory Vitals:   05/08/18 1027  BP: 121/83  Pulse: 64  Resp: 18  Temp: 97.6 F (36.4 C)   Filed Weights   05/08/18 1027  Weight: 218 lb 8 oz (99.1 kg)   Physical Exam  Constitutional: She is oriented to person, place, and time. No distress.  HENT:  Head: Normocephalic and atraumatic.  Nose: Nose normal.  Mouth/Throat: Oropharynx is clear and moist. No oropharyngeal exudate.  Eyes: Pupils are equal, round, and reactive to light. Conjunctivae and EOM are normal. No scleral icterus.  Neck: Normal range of motion. Neck supple. No JVD present.  A very faint scar from previous excisional lymph node biopsy  Cardiovascular: Normal rate and regular rhythm.  No murmur heard. Pulmonary/Chest: Effort normal and breath sounds normal. No respiratory distress. She has no wheezes. She has no rales. She exhibits no tenderness.  Abdominal: Soft. Bowel sounds are normal. She exhibits no distension. There is no tenderness.  Musculoskeletal: Normal range of motion. She exhibits no edema.    Lymphadenopathy:    She has no cervical adenopathy.  Neurological: She is alert and oriented to person, place, and time. No cranial nerve deficit. She exhibits normal muscle tone.  Skin: Skin is warm and dry. No erythema.  Psychiatric: Affect normal.     LABORATORY DATA:  I have reviewed the data as listed Lab Results  Component Value Date   WBC 8.0 10/02/2017   HGB 14.3 10/02/2017   HCT 41.8 10/02/2017   MCV 89.6 10/02/2017   PLT 290 10/02/2017   Recent Labs    10/02/17 1105  NA 137  K 3.9  CL 109  CO2 21*  GLUCOSE 92  BUN 14  CREATININE 0.67  CALCIUM 8.7*  GFRNONAA >60  GFRAA >60  PROT 7.1  ALBUMIN 4.1  AST 15  ALT 13*  ALKPHOS 61  BILITOT 0.9   09/25/2017 TSH 1.728  ESR 5 CRP <0.8 Peripheral blood Flowcytometry negative.  LDH 119. Ferritin 37 HIV negative Hepatitis panel negative.   RADIOGRAPHIC STUDIES: I have personally reviewed the radiological images as listed and  agreed with the findings in the report.  10/24/2017  CT chest abdomen pelvis with contrast 1. Mild right paratracheal and right hilar adenopathy in the chest. No other overt adenopathy is identified. No organomegaly or calcified adenopathy. No appreciable mesenteric mass or ascites. 2. 3 by 4 mm right peribronchovascular nodule on image 28/3. No follow-up needed if patient is low-risk. Non-contrast chest CT can be considered in 12 months if patient is high-risk. This recommendation follows the consensus statement: Guidelines for Management of Incidental Pulmonary Nodules Detected on CT Images: From the Fleischner Society 2017; Radiology 2017; 284:228-243. 3. Other imaging findings of potential clinical significance: Small type 1 hiatal hernia. Spondylosis and degenerative disc disease cause mild left foraminal impingement at L5-S1. Uterine fibroids. Enhancing lesion of the left ovary is most likely a corpus luteum  05/01/2018  CT soft tissue neck Negative CT neck.  No mass or  adenopathy  CT chest w contrast Mild right paratracheal and right hilar lymphadenopathy, measuring 10-11 mm short axis, mildly improved. No suspicious lymphadenopathy in the abdomen/pelvis  CT abdomen pelvis Mild right paratracheal and right hilar lymphadenopathy, measuring 10-11 mm short axis, mildly improved. No suspicious lymphadenopathy in the abdomen/pelvis.    ASSESSMENT & PLAN:  1. 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 Chronic fatigue   3. Weight loss    # Labs were reviewed and discussed with patient.   RF, ANA, light chain ratio, ferritin, CRP, ESR, fibrinogen, TSH all within normal limits.  CT scan was independently reviewed by me and discussed with patient.  Stable mild lymphadenopathy in right paratracheal and right hilar lymphadenopathy.  No suspicious lymphadenopathy in the neck/abdomen/pelvis She does not splenomegaly, fever, positive for weight loss and chronic fatigue.   Her previous lymph node biopsy done at outside facility was not available to me at this point.  I discussed with patient about her presumed Castleman syndrome. CT has been stable. I recommend to go to tertiary center and obtain a second opinion. She agrees with plan.   All questions were answered. The patient knows to call the clinic with any problems questions or concerns.  Return of visit: 6 months.  Earlie Server, MD, PhD Hematology Oncology [facility] at [facility] Pager- [phone] 05/09/18

## 2018-05-11 ENCOUNTER — Encounter: Payer: Self-pay | Admitting: Oncology

## 2018-11-07 ENCOUNTER — Ambulatory Visit: Payer: BLUE CROSS/BLUE SHIELD | Admitting: Oncology

## 2018-11-07 ENCOUNTER — Other Ambulatory Visit: Payer: Self-pay

## 2018-11-08 ENCOUNTER — Inpatient Hospital Stay: Payer: BLUE CROSS/BLUE SHIELD | Attending: Oncology | Admitting: Oncology

## 2018-11-08 ENCOUNTER — Encounter: Payer: Self-pay | Admitting: Oncology

## 2018-11-08 DIAGNOSIS — R634 Abnormal weight loss: Secondary | ICD-10-CM | POA: Diagnosis not present

## 2018-11-08 DIAGNOSIS — F17210 Nicotine dependence, cigarettes, uncomplicated: Secondary | ICD-10-CM | POA: Diagnosis not present

## 2018-11-08 DIAGNOSIS — Z79899 Other long term (current) drug therapy: Secondary | ICD-10-CM

## 2018-11-08 DIAGNOSIS — R5382 Chronic fatigue, unspecified: Secondary | ICD-10-CM | POA: Diagnosis not present

## 2018-11-08 DIAGNOSIS — D47Z2 Castleman disease: Secondary | ICD-10-CM

## 2018-11-08 NOTE — Progress Notes (Signed)
Patient contacted for virtual visit. No concerns voiced.

## 2018-11-10 NOTE — Progress Notes (Signed)
HEMATOLOGY-ONCOLOGY TeleHEALTH VISIT PROGRESS NOTE  I connected with Brooke Molina on 11/10/18 at  1:15 PM EDT by video enabled telemedicine visit and verified that I am speaking with the correct person using two identifiers. I discussed the limitations, risks, security and privacy concerns of performing an evaluation and management service by telemedicine and the availability of in-person appointments. I also discussed with the patient that there may be a patient responsible charge related to this service. The patient expressed understanding and agreed to proceed.   Other persons participating in the visit and their role in the encounter:  Geraldine Solar, [place], check in patient   Janeann Merl, RN, check in patient.   Patient's location: work Provider's location: Work Risk analyst Complaint: Follow-up for Castleman syndrome.   INTERVAL HISTORY Brooke Molina is a 43 y.o. female who has above history reviewed by me today presents for follow up visit for management of Castleman syndrome.  Problems and complaints are listed below:  During the interval patient has been referred to [facility] and obtain a second opinion for management of Castleman syndrome. Patient underwent cervical lymph node biopsy on 07/18/2018, pathology showed fragments of lymph nodes with Castleman-like features. 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 She reports her weight loss and fatigue has significantly improved after the treatments.  Review of Systems  Constitutional: Positive for fatigue. Negative for appetite change, chills and fever.  HENT:   Negative for hearing loss and voice change.   Eyes: Negative for eye problems.  Respiratory: Negative for chest tightness and cough.   Cardiovascular: Negative for chest pain.  Gastrointestinal: Negative for abdominal distention, abdominal pain and blood in stool.  Endocrine: Negative for hot flashes.  Genitourinary: Negative for  difficulty urinating and frequency.   Musculoskeletal: Negative for arthralgias.  Skin: Negative for itching and rash.  Neurological: Negative for extremity weakness.  Hematological: Negative for adenopathy.  Psychiatric/Behavioral: Negative for confusion.    Past Medical History:  Diagnosis Date  . Castleman disease [facility])    Past Surgical History:  Procedure Laterality Date  . TUBAL LIGATION      Family History  Problem Relation Age of Onset  . Breast cancer Maternal Aunt     Social History   Socioeconomic History  . Marital status: Married    Spouse name: Not on file  . Number of children: Not on file  . Years of education: Not on file  . Highest education level: Not on file  Occupational History  . Not on file  Social Needs  . Financial resource strain: Not on file  . Food insecurity:    Worry: Not on file    Inability: Not on file  . Transportation needs:    Medical: Not on file    Non-medical: Not on file  Tobacco Use  . Smoking status: Current Every Day Smoker    Packs/day: 1.00    Years: 24.00    Pack years: 24.00    Types: Cigarettes  . Smokeless tobacco: Never Used  Substance and Sexual Activity  . Alcohol use: No  . Drug use: No  . Sexual activity: Not on file  Lifestyle  . Physical activity:    Days per week: Not on file    Minutes per session: Not on file  . Stress: Not on file  Relationships  . Social connections:    Talks on phone: Not on file    Gets together: Not on file    Attends religious service: Not  on file    Active member of club or organization: Not on file    Attends meetings of clubs or organizations: Not on file    Relationship status: Not on file  . Intimate partner violence:    Fear of current or ex partner: Not on file    Emotionally abused: Not on file    Physically abused: Not on file    Forced sexual activity: Not on file  Other Topics Concern  . Not on file  Social History Narrative  . Not on file    Current  Outpatient Medications on File Prior to Visit  Medication Sig Dispense Refill  . escitalopram (LEXAPRO) 10 MG tablet TAKE 1 TABLET(10 MG) BY MOUTH EVERY DAY    . ibuprofen (ADVIL) 200 MG tablet Take by mouth.    . naproxen sodium (ALEVE) 220 MG tablet Take by mouth.    . siltuximab (SYLVANT) 400 MG SOLR Inject into the vein.     No current facility-administered medications on file prior to visit.     No Known Allergies     Observations/Objective: There were no vitals filed for this visit. There is no height or weight on file to calculate BMI.  Physical Exam  Constitutional: She is oriented to person, place, and time. No distress.  HENT:  Head: Normocephalic and atraumatic.  Pulmonary/Chest: Effort normal.  Neurological: She is alert and oriented to person, place, and time.  Psychiatric: Affect normal.    CBC    Component Value Date/Time   WBC 8.0 10/02/2017 1105   RBC 4.66 10/02/2017 1105   HGB 14.3 10/02/2017 1105   HCT 41.8 10/02/2017 1105   PLT 290 10/02/2017 1105   MCV 89.6 10/02/2017 1105   MCH 30.7 10/02/2017 1105   MCHC 34.3 10/02/2017 1105   RDW 13.8 10/02/2017 1105   LYMPHSABS 2.1 10/02/2017 1105   MONOABS 0.4 10/02/2017 1105   EOSABS 0.2 10/02/2017 1105   BASOSABS 0.1 10/02/2017 1105    CMP     Component Value Date/Time   NA 137 10/02/2017 1105   K 3.9 10/02/2017 1105   CL 109 10/02/2017 1105   CO2 21 (L) 10/02/2017 1105   GLUCOSE 92 10/02/2017 1105   BUN 14 10/02/2017 1105   CREATININE 0.67 10/02/2017 1105   CALCIUM 8.7 (L) 10/02/2017 1105   PROT 7.1 10/02/2017 1105   ALBUMIN 4.1 10/02/2017 1105   AST 15 10/02/2017 1105   ALT 13 (L) 10/02/2017 1105   ALKPHOS 61 10/02/2017 1105   BILITOT 0.9 10/02/2017 1105   GFRNONAA >60 10/02/2017 1105   GFRAA >60 10/02/2017 1105     Assessment and Plan: 1. 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[place])   2. Chronic fatigue   3. Weight loss     Possible Castleman Disease, starting Siltuximab treatment at [facility].  She tolerates  well and appear to have responded well to the treatment. Her constitutional symptoms- fatigue and weight loss have improved.  I discussed patient that since she has establish care with [facility] and is getting  treatment there, she will be released from my clinic.  If she has additional concerns or need in the future, I will be happy to see her again in our clinic.   Follow Up Instructions: No need for follow-up.  I discussed the assessment and treatment plan with the patient. The patient was provided an opportunity to ask questions and all were answered. The patient agreed with the plan and demonstrated an understanding of the instructions.  The  patient was advised to call back or seek an in-person evaluation if the symptoms worsen or if the condition fails to improve as anticipated.   Earlie Server, MD 11/10/2018 2:05 PM

## 2018-12-31 ENCOUNTER — Other Ambulatory Visit: Payer: Self-pay | Admitting: Obstetrics and Gynecology

## 2018-12-31 DIAGNOSIS — Z1231 Encounter for screening mammogram for malignant neoplasm of breast: Secondary | ICD-10-CM

## 2019-01-01 ENCOUNTER — Other Ambulatory Visit: Payer: Self-pay

## 2019-01-01 ENCOUNTER — Ambulatory Visit
Admission: RE | Admit: 2019-01-01 | Discharge: 2019-01-01 | Disposition: A | Discharge status: Home / Self Care | Payer: BC Managed Care – PPO | Source: Ambulatory Visit | Attending: Obstetrics and Gynecology | Admitting: Obstetrics and Gynecology

## 2019-01-01 DIAGNOSIS — Z1231 Encounter for screening mammogram for malignant neoplasm of breast: Secondary | ICD-10-CM | POA: Diagnosis not present

## 2019-06-10 ENCOUNTER — Other Ambulatory Visit: Payer: Self-pay | Admitting: Orthopedic Surgery

## 2019-06-20 ENCOUNTER — Inpatient Hospital Stay: Admission: RE | Admit: 2019-06-20 | Payer: BC Managed Care – PPO | Source: Ambulatory Visit

## 2019-06-30 ENCOUNTER — Other Ambulatory Visit: Payer: BC Managed Care – PPO

## 2019-07-01 ENCOUNTER — Inpatient Hospital Stay: Admit: 2019-07-01 | Payer: BC Managed Care – PPO | Admitting: Orthopedic Surgery

## 2019-07-01 SURGERY — ARTHROPLASTY, KNEE, TOTAL
Anesthesia: Choice | Site: Knee | Laterality: Left

## 2019-07-28 ENCOUNTER — Other Ambulatory Visit: Payer: Self-pay | Admitting: Family Medicine

## 2019-07-28 DIAGNOSIS — R109 Unspecified abdominal pain: Secondary | ICD-10-CM

## 2019-07-30 ENCOUNTER — Other Ambulatory Visit: Payer: Self-pay

## 2019-07-30 ENCOUNTER — Ambulatory Visit
Admission: RE | Admit: 2019-07-30 | Discharge: 2019-07-30 | Disposition: A | Discharge status: Home / Self Care | Payer: BC Managed Care – PPO | Source: Ambulatory Visit | Attending: Family Medicine | Admitting: Family Medicine

## 2019-07-30 DIAGNOSIS — R109 Unspecified abdominal pain: Secondary | ICD-10-CM | POA: Diagnosis not present

## 2019-09-08 ENCOUNTER — Other Ambulatory Visit: Payer: Self-pay | Admitting: Orthopedic Surgery

## 2019-09-15 ENCOUNTER — Other Ambulatory Visit
Admission: RE | Admit: 2019-09-15 | Discharge: 2019-09-15 | Disposition: A | Discharge status: Home / Self Care | Payer: BC Managed Care – PPO | Source: Ambulatory Visit | Attending: Orthopedic Surgery | Admitting: Orthopedic Surgery

## 2019-09-15 ENCOUNTER — Other Ambulatory Visit: Payer: Self-pay

## 2019-09-15 HISTORY — DX: Unspecified osteoarthritis, unspecified site: M19.90

## 2019-09-15 NOTE — Patient Instructions (Signed)
Your procedure is scheduled on: Tuesday September 23, 2019 Report to Day Surgery on the 2nd floor of the [facility]. To find out your arrival time, please call [phone] between 1PM - 3PM on: Monday September 22, 2019  REMEMBER: Instructions that are not followed completely may result in serious medical risk, up to and including death; or upon the discretion of your surgeon and anesthesiologist your surgery may need to be rescheduled.  Do not eat food after midnight the night before surgery.  No gum chewing, lozengers or hard candies.  You may however, drink CLEAR liquids up to 2 hours before you are scheduled to arrive for your surgery. Do not drink anything within 2 hours of the start of your surgery.  Clear liquids include: - water  - apple juice without pulp - gatorade (not RED) - black coffee or tea (Do NOT add milk or creamers to the coffee or tea) Do NOT drink anything that is not on this list.  Type 1 and Type 2 diabetics should only drink water.   TAKE THESE MEDICATIONS THE MORNING OF SURGERY WITH A SIP OF WATER: none  Follow recommendations from Cardiologist, Pulmonologist or PCP regarding stopping Aspirin, Coumadin, Plavix, Eliquis, Pradaxa, or Pletal.  Stop Anti-inflammatories (NSAIDS) such as Advil, Aleve, Ibuprofen, Motrin, Naproxen, Naprosyn and Aspirin based products such as Excedrin, Goodys Powder, BC Powder. (May take Tylenol or Acetaminophen if needed.)  Stop ANY OVER THE COUNTER supplements until after surgery. (May continue Vitamin D, Vitamin B, and multivitamin.)  No Alcohol for 24 hours before or after surgery.  No Smoking including e-cigarettes for 24 hours prior to surgery.  No chewable tobacco products for at least 6 hours prior to surgery.  No nicotine patches on the day of surgery.  On the morning of surgery brush your teeth with toothpaste and water, you may rinse your mouth with mouthwash if you wish. Do not swallow any toothpaste or  mouthwash.  Do not wear jewelry, make-up, hairpins, clips or nail polish.  Do not wear lotions, powders, or perfumes.   Do not shave 48 hours prior to surgery.   Contact lenses, hearing aids and dentures may not be worn into surgery.  Do not bring valuables to the hospital, including drivers license, insurance or credit cards.  [facility] is not responsible for any belongings or valuables.   Use CHG Soap  as directed on instruction sheet.  Notify your doctor if there is any change in your medical condition (cold, fever, infection).  Wear comfortable clothing (specific to your surgery type) to the hospital.  Plan for stool softeners for home use.  If you are being admitted to the hospital overnight, [place] After surgery it may be brought to your room.  If you are being discharged the day of surgery, you will not be allowed to drive home. You will need a responsible adult to drive you home and stay with you that night.   If you are taking public transportation, you will need to have a responsible adult with you. Please confirm with your physician that it is acceptable to use public transportation.   Please call [phone] if you have any questions about these instructions.  Visitation Policy:  Patients undergoing a surgery or procedure in a hospital may have one family member or support person with them as long as that person is not COVID-19 positive or experiencing its symptoms. That person may remain in the waiting area during  the procedure. Should the patient need to stay at the hospital during part of their recovery, the support person may visit during visiting hours; 10 am to 8 pm.  Children under 59 years of age may have both parents or legal guardians with them during their hospital stay.

## 2019-09-16 ENCOUNTER — Other Ambulatory Visit: Payer: Self-pay

## 2019-09-16 ENCOUNTER — Other Ambulatory Visit
Admission: RE | Admit: 2019-09-16 | Discharge: 2019-09-16 | Disposition: A | Discharge status: Home / Self Care | Payer: BC Managed Care – PPO | Source: Ambulatory Visit | Attending: Orthopedic Surgery | Admitting: Orthopedic Surgery

## 2019-09-16 DIAGNOSIS — Z01812 Encounter for preprocedural laboratory examination: Secondary | ICD-10-CM | POA: Insufficient documentation

## 2019-09-16 DIAGNOSIS — Z01810 Encounter for preprocedural cardiovascular examination: Secondary | ICD-10-CM | POA: Insufficient documentation

## 2019-09-16 LAB — URINALYSIS, ROUTINE W REFLEX MICROSCOPIC
Bacteria, UA: NONE SEEN
Bilirubin Urine: NEGATIVE
Glucose, UA: NEGATIVE mg/dL
Hgb urine dipstick: NEGATIVE
Ketones, ur: NEGATIVE mg/dL
Nitrite: NEGATIVE
Protein, ur: NEGATIVE mg/dL
Specific Gravity, Urine: 1.004 — ABNORMAL LOW (ref 1.005–1.030)
pH: 5 (ref 5.0–8.0)

## 2019-09-16 LAB — COMPREHENSIVE METABOLIC PANEL
ALT: 21 U/L (ref 0–44)
AST: 18 U/L (ref 15–41)
Albumin: 4.2 g/dL (ref 3.5–5.0)
Alkaline Phosphatase: 42 U/L (ref 38–126)
Anion gap: 8 (ref 5–15)
BUN: 9 mg/dL (ref 6–20)
CO2: 25 mmol/L (ref 22–32)
Calcium: 8.7 mg/dL — ABNORMAL LOW (ref 8.9–10.3)
Chloride: 106 mmol/L (ref 98–111)
Creatinine, Ser: 0.71 mg/dL (ref 0.44–1.00)
GFR calc Af Amer: >60 mL/min (ref >60)
GFR calc non Af Amer: >60 mL/min (ref >60)
Glucose, Bld: 82 mg/dL (ref 70–99)
Potassium: 3.7 mmol/L (ref 3.5–5.1)
Sodium: 139 mmol/L (ref 135–145)
Total Bilirubin: 0.7 mg/dL (ref 0.3–1.2)
Total Protein: 6.6 g/dL (ref 6.5–8.1)

## 2019-09-16 LAB — TYPE AND SCREEN
ABO/RH(D): O POS
Antibody Screen: NEGATIVE

## 2019-09-16 LAB — CBC WITH DIFFERENTIAL/PLATELET
Abs Immature Granulocytes: 0.04 10*3/uL (ref 0.00–0.07)
Basophils Absolute: 0.1 10*3/uL (ref 0.0–0.1)
Basophils Relative: 1 %
Eosinophils Absolute: 0.2 10*3/uL (ref 0.0–0.5)
Eosinophils Relative: 2 %
HCT: 40.2 % (ref 36.0–46.0)
Hemoglobin: 13.6 g/dL (ref 12.0–15.0)
Immature Granulocytes: 0 %
Lymphocytes Relative: 31 %
Lymphs Abs: 3 10*3/uL (ref 0.7–4.0)
MCH: 31.6 pg (ref 26.0–34.0)
MCHC: 33.8 g/dL (ref 30.0–36.0)
MCV: 93.5 fL (ref 80.0–100.0)
Monocytes Absolute: 0.6 10*3/uL (ref 0.1–1.0)
Monocytes Relative: 7 %
Neutro Abs: 5.5 10*3/uL (ref 1.7–7.7)
Neutrophils Relative %: 59 %
Platelets: 303 10*3/uL (ref 150–400)
RBC: 4.3 MIL/uL (ref 3.87–5.11)
RDW: 13.3 % (ref 11.5–15.5)
WBC: 9.4 10*3/uL (ref 4.0–10.5)
nRBC: 0 % (ref 0.0–0.2)

## 2019-09-16 LAB — SURGICAL PCR SCREEN
MRSA, PCR: NEGATIVE
Staphylococcus aureus: POSITIVE — AB

## 2019-09-19 ENCOUNTER — Other Ambulatory Visit: Payer: Self-pay

## 2019-09-19 ENCOUNTER — Other Ambulatory Visit
Admission: RE | Admit: 2019-09-19 | Discharge: 2019-09-19 | Disposition: A | Discharge status: Home / Self Care | Payer: BC Managed Care – PPO | Source: Ambulatory Visit | Attending: Orthopedic Surgery | Admitting: Orthopedic Surgery

## 2019-09-19 DIAGNOSIS — Z20822 Contact with and (suspected) exposure to covid-19: Secondary | ICD-10-CM | POA: Diagnosis not present

## 2019-09-19 DIAGNOSIS — Z01812 Encounter for preprocedural laboratory examination: Secondary | ICD-10-CM | POA: Diagnosis not present

## 2019-09-19 LAB — SARS CORONAVIRUS 2 (TAT 6-24 HRS): SARS Coronavirus 2: NEGATIVE

## 2019-09-23 ENCOUNTER — Other Ambulatory Visit: Payer: Self-pay

## 2019-09-23 ENCOUNTER — Inpatient Hospital Stay: Payer: BC Managed Care – PPO

## 2019-09-23 ENCOUNTER — Inpatient Hospital Stay
Admission: RE | Admit: 2019-09-23 | Discharge: 2019-09-25 | DRG: 470 | Disposition: A | Discharge status: Home Health | Payer: BC Managed Care – PPO | Attending: Orthopedic Surgery | Admitting: Orthopedic Surgery

## 2019-09-23 ENCOUNTER — Encounter
Admission: RE | Disposition: A | Discharge status: Home Health | Payer: Self-pay | Source: Home / Self Care | Attending: Orthopedic Surgery

## 2019-09-23 ENCOUNTER — Encounter: Payer: Self-pay | Admitting: Orthopedic Surgery

## 2019-09-23 DIAGNOSIS — F17210 Nicotine dependence, cigarettes, uncomplicated: Secondary | ICD-10-CM | POA: Diagnosis present

## 2019-09-23 DIAGNOSIS — Z96652 Presence of left artificial knee joint: Secondary | ICD-10-CM

## 2019-09-23 DIAGNOSIS — Z20822 Contact with and (suspected) exposure to covid-19: Secondary | ICD-10-CM | POA: Diagnosis present

## 2019-09-23 DIAGNOSIS — D47Z2 Castleman disease: Secondary | ICD-10-CM | POA: Diagnosis present

## 2019-09-23 DIAGNOSIS — F411 Generalized anxiety disorder: Secondary | ICD-10-CM | POA: Diagnosis present

## 2019-09-23 DIAGNOSIS — M1712 Unilateral primary osteoarthritis, left knee: Principal | ICD-10-CM | POA: Diagnosis present

## 2019-09-23 DIAGNOSIS — G8918 Other acute postprocedural pain: Secondary | ICD-10-CM

## 2019-09-23 HISTORY — PX: TOTAL KNEE ARTHROPLASTY: SHX125

## 2019-09-23 LAB — CBC
HCT: 40.5 % (ref 36.0–46.0)
Hemoglobin: 13.4 g/dL (ref 12.0–15.0)
MCH: 31.2 pg (ref 26.0–34.0)
MCHC: 33.1 g/dL (ref 30.0–36.0)
MCV: 94.4 fL (ref 80.0–100.0)
Platelets: 260 10*3/uL (ref 150–400)
RBC: 4.29 MIL/uL (ref 3.87–5.11)
RDW: 13.3 % (ref 11.5–15.5)
WBC: 11.9 10*3/uL — ABNORMAL HIGH (ref 4.0–10.5)
nRBC: 0 % (ref 0.0–0.2)

## 2019-09-23 LAB — CREATININE, SERUM
Creatinine, Ser: 0.61 mg/dL (ref 0.44–1.00)
GFR calc Af Amer: >60 mL/min (ref >60)
GFR calc non Af Amer: >60 mL/min (ref >60)

## 2019-09-23 LAB — ABO/RH: ABO/RH(D): O POS

## 2019-09-23 LAB — POCT PREGNANCY, URINE: Preg Test, Ur: NEGATIVE

## 2019-09-23 SURGERY — ARTHROPLASTY, KNEE, TOTAL
Anesthesia: Spinal | Site: Knee | Laterality: Left

## 2019-09-23 MED ORDER — PROPOFOL 10 MG/ML IV BOLUS
INTRAVENOUS | Status: DC | PRN
  Administered 2019-09-23: 50 mg via INTRAVENOUS

## 2019-09-23 MED ORDER — LIDOCAINE HCL (PF) 2 % IJ SOLN
INTRAMUSCULAR | Status: AC
  Filled 2019-09-23: qty 5

## 2019-09-23 MED ORDER — PHENOL 1.4 % MT LIQD
1.0000 | OROMUCOSAL | Status: DC | PRN
  Filled 2019-09-23: qty 177

## 2019-09-23 MED ORDER — BISACODYL 10 MG RE SUPP: 10.0000 mg | Freq: Every day | RECTAL | Status: DC | PRN

## 2019-09-23 MED ORDER — MENTHOL 3 MG MT LOZG
1.0000 | LOZENGE | OROMUCOSAL | Status: DC | PRN
  Filled 2019-09-23: qty 9

## 2019-09-23 MED ORDER — DEXAMETHASONE SODIUM PHOSPHATE 10 MG/ML IJ SOLN
INTRAMUSCULAR | Status: DC | PRN
  Administered 2019-09-23: 5 mg via INTRAVENOUS

## 2019-09-23 MED ORDER — ENOXAPARIN SODIUM 30 MG/0.3ML ~~LOC~~ SOLN
30.0000 mg | Freq: Two times a day (BID) | SUBCUTANEOUS | Status: DC
  Administered 2019-09-24 – 2019-09-25 (×3): 30 mg via SUBCUTANEOUS
  Filled 2019-09-23 (×3): qty 0.3

## 2019-09-23 MED ORDER — KETOROLAC TROMETHAMINE 30 MG/ML IJ SOLN
INTRAMUSCULAR | Status: DC | PRN
  Administered 2019-09-23: 30 mg

## 2019-09-23 MED ORDER — PROPOFOL 10 MG/ML IV BOLUS
INTRAVENOUS | Status: AC
  Filled 2019-09-23: qty 20

## 2019-09-23 MED ORDER — FENTANYL CITRATE (PF) 100 MCG/2ML IJ SOLN: 25.0000 ug | INTRAMUSCULAR | Status: DC | PRN

## 2019-09-23 MED ORDER — MIDAZOLAM HCL 5 MG/5ML IJ SOLN
INTRAMUSCULAR | Status: DC | PRN
  Administered 2019-09-23: 2 mg via INTRAVENOUS

## 2019-09-23 MED ORDER — METOCLOPRAMIDE HCL 5 MG/ML IJ SOLN
5.0000 mg | Freq: Three times a day (TID) | INTRAMUSCULAR | Status: DC | PRN
  Administered 2019-09-24: 10 mg via INTRAVENOUS
  Filled 2019-09-23: qty 2

## 2019-09-23 MED ORDER — FAMOTIDINE 20 MG PO TABS
ORAL_TABLET | ORAL | Status: AC
  Administered 2019-09-23: 20 mg via ORAL
  Filled 2019-09-23: qty 1

## 2019-09-23 MED ORDER — SODIUM CHLORIDE 0.9 % IV SOLN: INTRAVENOUS | Status: DC

## 2019-09-23 MED ORDER — OXYCODONE HCL 5 MG PO TABS
5.0000 mg | ORAL_TABLET | ORAL | Status: DC | PRN
  Administered 2019-09-23: 5 mg via ORAL
  Administered 2019-09-25: 10 mg via ORAL
  Filled 2019-09-23: qty 1
  Filled 2019-09-23: qty 2

## 2019-09-23 MED ORDER — ONDANSETRON HCL 4 MG/2ML IJ SOLN
INTRAMUSCULAR | Status: AC
  Filled 2019-09-23: qty 2

## 2019-09-23 MED ORDER — BUPIVACAINE LIPOSOME 1.3 % IJ SUSP
INTRAMUSCULAR | Status: AC
  Filled 2019-09-23: qty 40

## 2019-09-23 MED ORDER — ALUM & MAG HYDROXIDE-SIMETH 200-200-20 MG/5ML PO SUSP: 30.0000 mL | ORAL | Status: DC | PRN

## 2019-09-23 MED ORDER — BUPIVACAINE-EPINEPHRINE (PF) 0.25% -1:200000 IJ SOLN
INTRAMUSCULAR | Status: AC
  Filled 2019-09-23: qty 60

## 2019-09-23 MED ORDER — FENTANYL CITRATE (PF) 100 MCG/2ML IJ SOLN
INTRAMUSCULAR | Status: DC | PRN
  Administered 2019-09-23 (×4): 25 ug via INTRAVENOUS

## 2019-09-23 MED ORDER — HYDROMORPHONE HCL 1 MG/ML IJ SOLN
0.5000 mg | INTRAMUSCULAR | Status: DC | PRN
  Administered 2019-09-23 – 2019-09-25 (×2): 1 mg via INTRAVENOUS
  Filled 2019-09-23 (×2): qty 1

## 2019-09-23 MED ORDER — METOCLOPRAMIDE HCL 10 MG PO TABS: 5.0000 mg | ORAL_TABLET | Freq: Three times a day (TID) | ORAL | Status: DC | PRN

## 2019-09-23 MED ORDER — ONDANSETRON HCL 4 MG PO TABS
4.0000 mg | ORAL_TABLET | Freq: Four times a day (QID) | ORAL | Status: DC | PRN
  Administered 2019-09-24 – 2019-09-25 (×3): 4 mg via ORAL
  Filled 2019-09-23 (×3): qty 1

## 2019-09-23 MED ORDER — BUPIVACAINE-EPINEPHRINE (PF) 0.25% -1:200000 IJ SOLN
INTRAMUSCULAR | Status: DC | PRN
  Administered 2019-09-23: 30 mL

## 2019-09-23 MED ORDER — METHOCARBAMOL 500 MG PO TABS
500.0000 mg | ORAL_TABLET | Freq: Four times a day (QID) | ORAL | Status: DC | PRN
  Administered 2019-09-25: 500 mg via ORAL
  Filled 2019-09-23: qty 1

## 2019-09-23 MED ORDER — SODIUM CHLORIDE 0.9 % IV SOLN
INTRAVENOUS | Status: DC | PRN
  Administered 2019-09-23: 09:00:00 60 mL

## 2019-09-23 MED ORDER — DIPHENHYDRAMINE HCL 12.5 MG/5ML PO ELIX: 12.5000 mg | ORAL_SOLUTION | ORAL | Status: DC | PRN

## 2019-09-23 MED ORDER — NEOMYCIN-POLYMYXIN B GU 40-200000 IR SOLN
Status: DC | PRN
  Administered 2019-09-23: 14 mL

## 2019-09-23 MED ORDER — FAMOTIDINE 20 MG PO TABS: 20.0000 mg | ORAL_TABLET | Freq: Once | ORAL | Status: AC

## 2019-09-23 MED ORDER — CEFAZOLIN SODIUM-DEXTROSE 2-4 GM/100ML-% IV SOLN
INTRAVENOUS | Status: AC
  Filled 2019-09-23: qty 100

## 2019-09-23 MED ORDER — SODIUM CHLORIDE (PF) 0.9 % IJ SOLN
INTRAMUSCULAR | Status: DC | PRN
  Administered 2019-09-23: 30 mL

## 2019-09-23 MED ORDER — TRAMADOL HCL 50 MG PO TABS
50.0000 mg | ORAL_TABLET | Freq: Four times a day (QID) | ORAL | Status: DC
  Administered 2019-09-23 – 2019-09-25 (×9): 50 mg via ORAL
  Filled 2019-09-23 (×9): qty 1

## 2019-09-23 MED ORDER — ZOLPIDEM TARTRATE 5 MG PO TABS: 5.0000 mg | ORAL_TABLET | Freq: Every evening | ORAL | Status: DC | PRN

## 2019-09-23 MED ORDER — ONDANSETRON HCL 4 MG/2ML IJ SOLN
4.0000 mg | Freq: Four times a day (QID) | INTRAMUSCULAR | Status: DC | PRN
  Administered 2019-09-23 – 2019-09-25 (×3): 4 mg via INTRAVENOUS
  Filled 2019-09-23 (×3): qty 2

## 2019-09-23 MED ORDER — MORPHINE SULFATE (PF) 10 MG/ML IV SOLN
INTRAVENOUS | Status: AC
  Filled 2019-09-23: qty 1

## 2019-09-23 MED ORDER — ACETAMINOPHEN 500 MG PO TABS
1000.0000 mg | ORAL_TABLET | Freq: Four times a day (QID) | ORAL | Status: AC
  Administered 2019-09-23 – 2019-09-24 (×4): 1000 mg via ORAL
  Filled 2019-09-23 (×3): qty 2

## 2019-09-23 MED ORDER — DEXAMETHASONE SODIUM PHOSPHATE 10 MG/ML IJ SOLN
INTRAMUSCULAR | Status: AC
  Filled 2019-09-23: qty 1

## 2019-09-23 MED ORDER — ONDANSETRON HCL 4 MG/2ML IJ SOLN: 4.0000 mg | Freq: Once | INTRAMUSCULAR | Status: DC | PRN

## 2019-09-23 MED ORDER — PHENYLEPHRINE HCL (PRESSORS) 10 MG/ML IV SOLN
INTRAVENOUS | Status: AC
  Filled 2019-09-23: qty 1

## 2019-09-23 MED ORDER — KETOROLAC TROMETHAMINE 30 MG/ML IJ SOLN
INTRAMUSCULAR | Status: AC
  Filled 2019-09-23: qty 1

## 2019-09-23 MED ORDER — DOCUSATE SODIUM 100 MG PO CAPS
100.0000 mg | ORAL_CAPSULE | Freq: Two times a day (BID) | ORAL | Status: DC
  Administered 2019-09-24 – 2019-09-25 (×3): 100 mg via ORAL
  Filled 2019-09-23 (×3): qty 1

## 2019-09-23 MED ORDER — MORPHINE SULFATE 10 MG/ML IJ SOLN
INTRAMUSCULAR | Status: DC | PRN
  Administered 2019-09-23: 10 mg

## 2019-09-23 MED ORDER — FENTANYL CITRATE (PF) 100 MCG/2ML IJ SOLN
INTRAMUSCULAR | Status: AC
  Filled 2019-09-23: qty 2

## 2019-09-23 MED ORDER — MAGNESIUM CITRATE PO SOLN
1.0000 | Freq: Once | ORAL | Status: AC | PRN
  Administered 2019-09-25: 1 via ORAL
  Filled 2019-09-23 (×2): qty 296

## 2019-09-23 MED ORDER — CEFAZOLIN SODIUM-DEXTROSE 2-4 GM/100ML-% IV SOLN
2.0000 g | INTRAVENOUS | Status: AC
  Administered 2019-09-23: 2 g via INTRAVENOUS

## 2019-09-23 MED ORDER — PROPOFOL 500 MG/50ML IV EMUL
INTRAVENOUS | Status: AC
  Filled 2019-09-23: qty 50

## 2019-09-23 MED ORDER — BUPIVACAINE HCL (PF) 0.5 % IJ SOLN
INTRAMUSCULAR | Status: AC
  Filled 2019-09-23: qty 10

## 2019-09-23 MED ORDER — LACTATED RINGERS IV SOLN: INTRAVENOUS | Status: DC

## 2019-09-23 MED ORDER — MIDAZOLAM HCL 2 MG/2ML IJ SOLN
INTRAMUSCULAR | Status: AC
  Filled 2019-09-23: qty 2

## 2019-09-23 MED ORDER — MAGNESIUM HYDROXIDE 400 MG/5ML PO SUSP: 30.0000 mL | Freq: Every day | ORAL | Status: DC | PRN

## 2019-09-23 MED ORDER — SODIUM CHLORIDE FLUSH 0.9 % IV SOLN
INTRAVENOUS | Status: AC
  Filled 2019-09-23: qty 70

## 2019-09-23 MED ORDER — CEFAZOLIN SODIUM-DEXTROSE 2-4 GM/100ML-% IV SOLN
2.0000 g | Freq: Four times a day (QID) | INTRAVENOUS | Status: AC
  Administered 2019-09-23 – 2019-09-24 (×3): 2 g via INTRAVENOUS
  Filled 2019-09-23 (×5): qty 100

## 2019-09-23 MED ORDER — ACETAMINOPHEN 325 MG PO TABS: 325.0000 mg | ORAL_TABLET | Freq: Four times a day (QID) | ORAL | Status: DC | PRN

## 2019-09-23 MED ORDER — CHLORHEXIDINE GLUCONATE 4 % EX LIQD: 60.0000 mL | Freq: Once | CUTANEOUS | Status: DC

## 2019-09-23 MED ORDER — OXYCODONE HCL 5 MG PO TABS
10.0000 mg | ORAL_TABLET | ORAL | Status: DC | PRN
  Administered 2019-09-23 – 2019-09-24 (×3): 15 mg via ORAL
  Administered 2019-09-24: 10 mg via ORAL
  Administered 2019-09-24 – 2019-09-25 (×3): 15 mg via ORAL
  Filled 2019-09-23: qty 2
  Filled 2019-09-23 (×6): qty 3

## 2019-09-23 MED ORDER — PROPOFOL 500 MG/50ML IV EMUL
INTRAVENOUS | Status: DC | PRN
  Administered 2019-09-23: 75 ug/kg/min via INTRAVENOUS

## 2019-09-23 MED ORDER — ONDANSETRON HCL 4 MG/2ML IJ SOLN
INTRAMUSCULAR | Status: DC | PRN
  Administered 2019-09-23: 4 mg via INTRAVENOUS

## 2019-09-23 MED ORDER — PANTOPRAZOLE SODIUM 40 MG PO TBEC
40.0000 mg | DELAYED_RELEASE_TABLET | Freq: Every day | ORAL | Status: DC
  Administered 2019-09-23 – 2019-09-25 (×3): 40 mg via ORAL
  Filled 2019-09-23 (×3): qty 1

## 2019-09-23 MED ORDER — NEOMYCIN-POLYMYXIN B GU 40-200000 IR SOLN
Status: AC
  Filled 2019-09-23: qty 40

## 2019-09-23 MED ORDER — METHOCARBAMOL 1000 MG/10ML IJ SOLN
500.0000 mg | Freq: Four times a day (QID) | INTRAVENOUS | Status: DC | PRN
  Filled 2019-09-23: qty 5

## 2019-09-23 SURGICAL SUPPLY — 68 items
BLADE SAGITTAL 25.0X1.19X90 (BLADE) ×2 IMPLANT
BNDG ELASTIC 6X5.8 VLCR STR LF (GAUZE/BANDAGES/DRESSINGS) ×2 IMPLANT
CANISTER SUCT 1200ML W/VALVE (MISCELLANEOUS) ×2 IMPLANT
CANISTER SUCT 3000ML PPV (MISCELLANEOUS) ×4 IMPLANT
CANISTER WOUND CARE 500ML ATS (WOUND CARE) ×1 IMPLANT
CEMENT FEMORAL COMP SZ3P LEFT (Femur) ×1 IMPLANT
CEMENT HV SMART SET (Cement) ×4 IMPLANT
CHLORAPREP W/TINT 26 (MISCELLANEOUS) ×4 IMPLANT
COOLER POLAR GLACIER W/PUMP (MISCELLANEOUS) ×2 IMPLANT
COVER WAND RF STERILE (DRAPES) ×2 IMPLANT
CUFF TOURN SGL QUICK 24 (TOURNIQUET CUFF)
CUFF TOURN SGL QUICK 30 (TOURNIQUET CUFF) ×1
CUFF TRNQT CYL 24X4X16.5-23 (TOURNIQUET CUFF) IMPLANT
CUFF TRNQT CYL 30X4X21-28X (TOURNIQUET CUFF) IMPLANT
DRAPE 3/4 80X56 (DRAPES) ×4 IMPLANT
ELECT CAUTERY BLADE 6.4 (BLADE) ×2 IMPLANT
ELECT REM PT RETURN 9FT ADLT (ELECTROSURGICAL) ×2
ELECTRODE REM PT RTRN 9FT ADLT (ELECTROSURGICAL) ×1 IMPLANT
GAUZE SPONGE 4X4 12PLY STRL (GAUZE/BANDAGES/DRESSINGS) ×2 IMPLANT
GAUZE XEROFORM 1X8 LF (GAUZE/BANDAGES/DRESSINGS) ×2 IMPLANT
GLOVE BIOGEL PI IND STRL 9 (GLOVE) ×1 IMPLANT
GLOVE BIOGEL PI INDICATOR 9 (GLOVE) ×1
GLOVE INDICATOR 8.0 STRL GRN (GLOVE) ×2 IMPLANT
GLOVE SURG ORTHO 8.0 STRL STRW (GLOVE) ×2 IMPLANT
GLOVE SURG SYN 9.0  PF PI (GLOVE) ×1
GLOVE SURG SYN 9.0 PF PI (GLOVE) ×1 IMPLANT
GOWN SRG 2XL LVL 4 RGLN SLV (GOWNS) ×1 IMPLANT
GOWN STRL NON-REIN 2XL LVL4 (GOWNS) ×1
GOWN STRL REUS W/ TWL LRG LVL3 (GOWN DISPOSABLE) ×1 IMPLANT
GOWN STRL REUS W/ TWL XL LVL3 (GOWN DISPOSABLE) ×1 IMPLANT
GOWN STRL REUS W/TWL LRG LVL3 (GOWN DISPOSABLE) ×1
GOWN STRL REUS W/TWL XL LVL3 (GOWN DISPOSABLE) ×1
HOLDER FOLEY CATH W/STRAP (MISCELLANEOUS) ×2 IMPLANT
HOOD PEEL AWAY FLYTE STAYCOOL (MISCELLANEOUS) ×4 IMPLANT
INSERT TIBIAL SZ3 L HT 10M FLX (Insert) ×1 IMPLANT
KIT PREVENA INCISION MGT20CM45 (CANNISTER) ×1 IMPLANT
KIT TURNOVER KIT A (KITS) ×2 IMPLANT
NDL SAFETY ECLIPSE 18X1.5 (NEEDLE) ×1 IMPLANT
NDL SPNL 18GX3.5 QUINCKE PK (NEEDLE) ×1 IMPLANT
NDL SPNL 20GX3.5 QUINCKE YW (NEEDLE) ×1 IMPLANT
NEEDLE HYPO 18GX1.5 SHARP (NEEDLE) ×1
NEEDLE SPNL 18GX3.5 QUINCKE PK (NEEDLE) ×2 IMPLANT
NEEDLE SPNL 20GX3.5 QUINCKE YW (NEEDLE) ×2 IMPLANT
NS IRRIG 1000ML POUR BTL (IV SOLUTION) ×2 IMPLANT
PACK TOTAL KNEE (MISCELLANEOUS) ×2 IMPLANT
PAD WRAPON POLAR KNEE (MISCELLANEOUS) ×1 IMPLANT
PATELLA RESURFACING MEDACTA 02 (Bone Implant) ×1 IMPLANT
PENCIL SMOKE EVACUATOR COATED (MISCELLANEOUS) ×2 IMPLANT
PULSAVAC PLUS IRRIG FAN TIP (DISPOSABLE) ×2
SCALPEL PROTECTED #10 DISP (BLADE) ×4 IMPLANT
SOL .9 NS 3000ML IRR  AL (IV SOLUTION) ×1
SOL .9 NS 3000ML IRR UROMATIC (IV SOLUTION) ×1 IMPLANT
STAPLER SKIN PROX 35W (STAPLE) ×2 IMPLANT
STEM EXTENSION 11MMX30MM (Stem) ×1 IMPLANT
SUCTION FRAZIER HANDLE 10FR (MISCELLANEOUS) ×1
SUCTION TUBE FRAZIER 10FR DISP (MISCELLANEOUS) ×1 IMPLANT
SUT DVC 2 QUILL PDO  T11 36X36 (SUTURE) ×1
SUT DVC 2 QUILL PDO T11 36X36 (SUTURE) ×1 IMPLANT
SUT ETHIBOND 2 V 37 (SUTURE) ×1 IMPLANT
SUT V-LOC 90 ABS DVC 3-0 CL (SUTURE) ×2 IMPLANT
SYR 20ML LL LF (SYRINGE) ×2 IMPLANT
SYR 50ML LL SCALE MARK (SYRINGE) ×4 IMPLANT
TIBIAL TRAY FIXED MEDACTA 0207 (Bone Implant) ×1 IMPLANT
TIP FAN IRRIG PULSAVAC PLUS (DISPOSABLE) ×1 IMPLANT
TOWEL OR 17X26 4PK STRL BLUE (TOWEL DISPOSABLE) ×2 IMPLANT
TOWER CARTRIDGE SMART MIX (DISPOSABLE) ×2 IMPLANT
TRAY FOLEY MTR SLVR 16FR STAT (SET/KITS/TRAYS/PACK) ×2 IMPLANT
WRAPON POLAR PAD KNEE (MISCELLANEOUS) ×2

## 2019-09-23 NOTE — Transfer of Care (Signed)
Immediate Anesthesia Transfer of Care Note  Patient: Jewell Ambriz  Procedure(s) Performed: LEFT TOTAL KNEE ARTHROPLASTY (Left Knee)  Patient Location: PACU  Anesthesia Type:Spinal  Level of Consciousness: alert  and oriented  Airway & Oxygen Therapy: Patient Spontanous Breathing and Patient connected to face mask oxygen  Post-op Assessment: Report given to RN and Post -op Vital signs reviewed and stable  Post vital signs: Reviewed and stable  Last Vitals:  Vitals Value Taken Time  BP 110/70 09/23/19 0919  Temp    Pulse 68 09/23/19 0920  Resp 20 09/23/19 0920  SpO2 100 % 09/23/19 0920  Vitals shown include unvalidated device data.  Last Pain:  Vitals:   09/23/19 0633  TempSrc: Temporal  PainSc: 4          Complications: No apparent anesthesia complications

## 2019-09-23 NOTE — Anesthesia Preprocedure Evaluation (Signed)
Anesthesia Evaluation  Patient identified by MRN, date of birth, ID band Patient awake    Reviewed: Allergy & Precautions, NPO status , Patient's Chart, lab work & pertinent test results  Airway Mallampati: II       Dental   Pulmonary Current Smoker and Patient abstained from smoking.,    Pulmonary exam normal        Cardiovascular negative cardio ROS Normal cardiovascular exam     Neuro/Psych negative neurological ROS  negative psych ROS   GI/Hepatic negative GI ROS, Neg liver ROS,   Endo/Other  negative endocrine ROS  Renal/GU negative Renal ROS  negative genitourinary   Musculoskeletal  (+) Arthritis , Osteoarthritis,    Abdominal Normal abdominal exam  (+)   Peds negative pediatric ROS (+)  Hematology negative hematology ROS (+)   Anesthesia Other Findings Past Medical History: No date: 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[place]) No date: Osteoarthritis     Comment:  bilateral knees  Reproductive/Obstetrics                             Anesthesia Physical Anesthesia Plan  ASA: II  Anesthesia Plan: Spinal   Post-op Pain Management:    Induction: Intravenous  PONV Risk Score and Plan:   Airway Management Planned: Nasal Cannula  Additional Equipment:   Intra-op Plan:   Post-operative Plan:   Informed Consent: I have reviewed the patients History and Physical, chart, labs and discussed the procedure including the risks, benefits and alternatives for the proposed anesthesia with the patient or authorized representative who has indicated his/her understanding and acceptance.     Dental advisory given  Plan Discussed with: CRNA and Surgeon  Anesthesia Plan Comments:         Anesthesia Quick Evaluation

## 2019-09-23 NOTE — Op Note (Signed)
09/23/2019  9:22 AM  PATIENT:  Brooke Molina  44 y.o. female  PRE-OPERATIVE DIAGNOSIS:  Primary osteoarthritis of left knee  POST-OPERATIVE DIAGNOSIS:  Primary osteoarthritis of left knee  PROCEDURE:  Procedure(s): LEFT TOTAL KNEE ARTHROPLASTY (Left)  SURGEON: Laurene Footman, MD  ASSISTANTS: Rachelle Hora, PA-C  ANESTHESIA:   spinal  EBL:  Total I/O In: 1200 [I.V.:1200] Out: 225 [Urine:125; Blood:100]  BLOOD ADMINISTERED:none  DRAINS: none   LOCAL MEDICATIONS USED:  MARCAINE    and OTHER Exparel morphine and Toradol  SPECIMEN:  No Specimen  DISPOSITION OF SPECIMEN:  N/A  COUNTS:  YES  TOURNIQUET:   Total Tourniquet Time Documented: Thigh (Left) - 70 minutes Total: Thigh (Left) - 70 minutes   IMPLANTS: Medacta GMK Sphere 3+ femur, 3 tibia baseplate with short stem and 10 mm insert, 2 patella, all components cemented  DICTATION: .Dragon Dictation patient was brought to the operating room and after adequate spinal anesthesia was obtained the left leg was prepped and draped in the usual sterile fashion with a tourniquet applied to the upper thigh.  After patient identification and timeout procedures were completed with the knee in flexion a midline skin incision was made after tourniquet inflation.  A medial parapatellar arthrotomy was performed and there is extensive medial and patellofemoral degenerative changes present.  The ACL and PCL were cut along with excision of the fat pad.  Anterior edge of the menisci removed and the extra medullary tibial alignment guide utilized.  Proximal tibia cut was carried out and this was followed by placement of the distal femoral drill hole and a 5 degree distal femoral cut.  With additional bone removed secondary to a flexion contracture.  The femur sized to a 4 and subsequently was downsized to a 3+.  Anterior posterior and chamfer cuts being carried out.  The posterior horns of the menisci were excised at this time.  The tibial  baseplate was pinned into position and proximal tibial preparation carried out with a keel punch placed.  3+ femur applied and a 10 mm insert gave excellent stability full extension and the patella tracked well.  The distal femoral drill holes were made followed by the trochlear groove cut.  At this point the Exparel and above medications were injected throughout the joint to aid in postop analgesia.  The patella was cut using the patellar cutting guide sized to a size 2 after drill holes were made.  The bony surfaces were thoroughly irrigated and dried.  The tibial component was cemented in place first with excess cement removed followed by placement of the polyethylene insert and then the femoral component was cemented into position with excess cement removed.  The knee was held in extension with the patellar button cemented and clamped into place.  After the cemented set excess cement was removed and the knee irrigated with pulsatile lavage with a tourniquet let down.  Hemostasis was checked with electrocautery.  The arthrotomy was then repaired using a heavy Quill with good tracking of the patellofemoral joint with no touch technique and 3-0 V-Loc to close the subcutaneous tissue followed by skin staples Xeroform 4 x 4's ABD web roll and Ace wrap and Polar Care.  PLAN OF CARE: Admit to inpatient   PATIENT DISPOSITION:  PACU - hemodynamically stable.

## 2019-09-23 NOTE — Anesthesia Procedure Notes (Signed)
Spinal  Patient location during procedure: OR Staffing Anesthesiologist: Alvin Critchley, MD Resident/CRNA: Caryl Asp, CRNA Other anesthesia staff: Janeann Forehand, RN Preanesthetic Checklist Completed: patient identified, IV checked, site marked, risks and benefits discussed, surgical consent, monitors and equipment checked, pre-op evaluation and timeout performed Spinal Block Patient position: sitting Prep: Betadine Patient monitoring: heart rate, continuous pulse ox, blood pressure and cardiac monitor Approach: midline Location: L4-5 Injection technique: single-shot Needle Needle type: Whitacre and Introducer  Needle gauge: 24 G Needle length: 9 cm Additional Notes Negative paresthesia. Negative blood return. Positive free-flowing CSF. Expiration date of kit checked and confirmed. Patient tolerated procedure well, without complications.

## 2019-09-23 NOTE — H&P (Signed)
History of the Present Illness: Brooke Molina is a 44 y.o. female here for history and physical for left total knee arthroplasty with Dr. Hessie Knows on 09/23/2019. She has had corticosteroid injections, bracing, and physical therapy without relief of pain. Symptoms have been progressively getting worse for many years. She has had x-rays and MRI showing extensive cartilage loss in the medial and patellofemoral compartments. Pain is interfering with activities daily living and quality of life.  The patient takes siltuximab for Castleman disease, which will be held for 2 after surgery.   I have reviewed past medical, surgical, social and family history, and allergies as documented in the EMR.  Past Medical History: Past Medical History:  Diagnosis Date  . Anxiety, generalized 09/11/2018  . 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CMS-H[facility])  . Primary osteoarthritis of both knees  . Tobacco use   Past Surgical History: Past Surgical History:  Procedure Laterality Date  . ear tubes 1980  . lymph node removal Left 2013  left cervical lymph node  . TONSILLECTOMY 1980  . TUBAL LIGATION 1998   Past Family History: Family History  Problem Relation Age of Onset  . Atrial fibrillation (Abnormal heart rhythm sometimes requiring treatment with blood thinners) Mother  . Epilepsy Mother  . Endometriosis Sister  . Breast cancer Maternal Aunt  . Coronary Artery Disease (Blocked arteries around heart) Maternal Grandfather   Medications: Current Outpatient Medications Ordered in Epic  Medication Sig Dispense Refill  . acetaminophen (TYLENOL) 500 MG tablet Take 1,000 mg by mouth every 8 (eight) hours as needed for Pain  . ibuprofen (MOTRIN) 200 MG tablet Take 600 mg by mouth every 6 (six) hours as needed for Pain  . predniSONE (DELTASONE) 10 MG tablet 6 pills x 1 day, 5 pills x 1 day, 4 pills x 1 day, 3 pills x 1 day, 2 pills x 1 day. 1 pill x 1 day. 21 tablet 0  . siltuximab (SYLVANT) 400 mg injection Inject 11  mg/kg into the vein every 21 (twenty-one) days   No current Epic-ordered facility-administered medications on file.   Allergies: No Known Allergies   There is no height or weight on file to calculate BMI.  Review of Systems: A comprehensive 14 point ROS was performed, reviewed, and the pertinent orthopaedic findings are documented in the HPI.  There were no vitals filed for this visit.  General Physical Examination:  General:  Well developed, well nourished, no apparent distress, normal affect, normal gait with no antalgic component.   HEENT: Head normocephalic, atraumatic, PERRL.   Abdomen: Soft, non tender, non distended, Bowel sounds present.  Heart: Examination of the heart reveals regular, rate, and rhythm. There is no murmur noted on ascultation. There is a normal apical pulse.  Lungs: Lungs are clear to auscultation. There is no wheeze, rhonchi, or crackles. There is normal expansion of bilateral chest walls.   Musculoskeletal Examination: On exam, the patient has range of motion of 0-120 degrees with crepitation. The patient also has a Baker cyst. There is a varus deformity that we can correct. Skin intact. Good pulses.   Radiographs: She had x-rays in 12/2018 that showed medial compartment osteoarthritis with posterior osteophytes of the tibia and femur, as well as severe patellofemoral arthritis.   Subsequently, she had an MRI that confirmed much more extensive osteoarthritis. Also showed full thickness cartilage loss of the medial and patellofemoral joint, and mild lateral degenerative arthritis.   Assessment: ICD-10-CM  1. Primary osteoarthritis of left knee M17.12   Plan: 1.  Risks, benefits, complications of a left total knee arthroplasty have been discussed with the patient. Patient has agreed and consented to procedure with Dr. Hessie Knows on 09/23/2019.    Electronically signed by Feliberto Gottron, PA at 09/16/2019 2:53 PM EDT  Reviewed paper  H+P, will be scanned into chart. No changes noted.

## 2019-09-23 NOTE — Evaluation (Signed)
Physical Therapy Evaluation Patient Details Name: Brooke Molina MRN: [id number] DOB: 1975/11/19 Today's Date: 09/23/2019   History of Present Illness  Per MD notes: Pt is a 44 y/o F with L knee OA and is s/p elective L TKA. PMH includes: castleman disease and OA  Clinical Impression  Pt pleasant and motivated to participate during the session. Pt found on RA and HR and SpO2 were WNL t/o the session. Pt was Mod I for bed mobility and CGA for transfers and amb. Pt was able to go from the bed to the recliner without physical assistance and only minimal cueing for RW and LE sequencing when ambulating. Pt will benefit from HHPT services upon discharge to safely address deficits listed in patient problem list for decreased caregiver assistance and eventual return to PLOF.       Follow Up Recommendations Home health PT    Equipment Recommendations  Rolling walker with 5" wheels;3in1 (PT)    Recommendations for Other Services       Precautions / Restrictions Precautions Precautions: Knee Precaution Booklet Issued: Yes (comment) Restrictions Weight Bearing Restrictions: Yes LLE Weight Bearing: Weight bearing as tolerated      Mobility  Bed Mobility Overal bed mobility: Modified Independent Bed Mobility: Supine to Sit     Supine to sit: Modified independent (Device/Increase time)     General bed mobility comments: no physical assistance or cueing required. Extra time and effort. Mangement of lines and leads needed  Transfers Overall transfer level: Needs assistance Equipment used: Rolling walker (2 wheeled) Transfers: Sit to/from Stand Sit to Stand: Min guard         General transfer comment: No physical assistance needed. Pt mostly used RLE for transfer and didn't WB through the LLE very much until after standing was achieved  Ambulation/Gait Ambulation/Gait assistance: Min guard Gait Distance (Feet): 3 Feet Assistive device: Rolling walker (2 wheeled) Gait  Pattern/deviations: Step-to pattern;Decreased step length - right;Antalgic;Decreased stance time - left Gait velocity: decreased   General Gait Details: Decreased WB through the LLE that caused decreased step length with the RLE. Pt was able to amb from EOB to the recliner with minimal cueing and demonstarted appropriate strategies. Cueing provided was for proper RW and LE sequencing.  Stairs            Wheelchair Mobility    Modified Rankin (Stroke Patients Only)       Balance Overall balance assessment: Needs assistance Sitting-balance support: No upper extremity supported;Feet unsupported Sitting balance-Leahy Scale: Good     Standing balance support: Bilateral upper extremity supported;During functional activity Standing balance-Leahy Scale: Fair Standing balance comment: In static standing, pt did not require UE assist to maintain upright posture. During amb, pt required mod A through the RW                             Pertinent Vitals/Pain Pain Assessment: 0-10 Pain Score: 4 ("3-4/10 at rest, 5-6/10 with walking") Pain Location: Mostly in distal L quad Pain Descriptors / Indicators: Aching;Sore Pain Intervention(s): Monitored during session;Premedicated before session;Ice applied    Home Living Family/patient expects to be discharged to:: Private residence Living Arrangements: Spouse/significant other;Children Available Help at Discharge: Family;Available 24 hours/day Type of Home: House Home Access: Stairs to enter Entrance Stairs-Rails: Right;Left;Can reach both Entrance Stairs-Number of Steps: 3 Home Layout: One level Home Equipment: None      Prior Function Level of Independence: Independent  Comments: Per pt: ind community amb, no AD use, ind with ADL's, denies falls in past six months     Hand Dominance        Extremity/Trunk Assessment        Lower Extremity Assessment Lower Extremity Assessment: Generalized  weakness;LLE deficits/detail LLE: Unable to fully assess due to pain LLE Sensation: WNL       Communication   Communication: No difficulties  Cognition Arousal/Alertness: Awake/alert Behavior During Therapy: WFL for tasks assessed/performed Overall Cognitive Status: Within Functional Limits for tasks assessed                                        General Comments      Exercises Total Joint Exercises Ankle Circles/Pumps: AROM;Strengthening;Both;10 reps Quad Sets: Strengthening;Both;10 reps Gluteal Sets: Strengthening;Both;10 reps Heel Slides: 10 reps;AROM;Right;AAROM;Left Hip ABduction/ADduction: 10 reps;AROM;Right;AAROM;Left Straight Leg Raises: 10 reps;AROM;Right;AAROM;Left Long Arc Quad: AROM;Strengthening;Both;10 reps Knee Flexion: AROM;Strengthening;Both;10 reps Goniometric ROM: L knee AROM: 10-88 deg Marching in Standing: AROM;Strengthening;Both;10 reps Other Exercises Other Exercises: HEP education Other Exercises: Education on WB status Other Exercises: Education on LLE position with transfers   Assessment/Plan    PT Assessment Patient needs continued PT services  PT Problem List Decreased strength;Decreased mobility;Decreased range of motion;Decreased knowledge of precautions;Decreased activity tolerance;Decreased balance;Decreased knowledge of use of DME;Pain       PT Treatment Interventions DME instruction;Therapeutic exercise;Gait training;Balance training;Stair training;Functional mobility training;Therapeutic activities;Patient/family education    PT Goals (Current goals can be found in the Care Plan section)  Acute Rehab PT Goals Patient Stated Goal: "get back to at least where I was before surgery" PT Goal Formulation: With patient Time For Goal Achievement: 10/06/19 Potential to Achieve Goals: Good    Frequency BID   Barriers to discharge        Co-evaluation               AM-PAC PT "6 Clicks" Mobility  Outcome Measure  Help needed turning from your back to your side while in a flat bed without using bedrails?: None Help needed moving from lying on your back to sitting on the side of a flat bed without using bedrails?: None Help needed moving to and from a bed to a chair (including a wheelchair)?: A Little Help needed standing up from a chair using your arms (e.g., wheelchair or bedside chair)?: A Little Help needed to walk in hospital room?: A Lot Help needed climbing 3-5 steps with a railing? : A Lot 6 Click Score: 18    End of Session Equipment Utilized During Treatment: Gait belt Activity Tolerance: Patient tolerated treatment well Patient left: in chair;with call bell/phone within reach;with chair alarm set;with family/visitor present;with SCD's reapplied;Polar Care applied Nurse Communication: Mobility status;Precautions;Weight bearing status(Nsg infomred of RLE brusing over fibula with TTP over fibular head) PT Visit Diagnosis: Unsteadiness on feet (R26.81);Other abnormalities of gait and mobility (R26.89);Muscle weakness (generalized) (M62.81);Pain Pain - Right/Left: Left Pain - part of body: Knee    Time: 1320-1417 PT Time Calculation (min) (ACUTE ONLY): 57 min   Charges:              Annabelle Harman, SPT 09/23/19 2:45 PM

## 2019-09-23 NOTE — Evaluation (Signed)
Occupational Therapy Evaluation Patient Details Name: Brooke Molina MRN: [id number] DOB: March 04, 1976 Today's Date: 09/23/2019    History of Present Illness Per MD notes: Pt is a 44 y/o F with L knee OA and is s/p elective L TKA. PMH includes: castleman disease and OA   Clinical Impression   Pt seen for OT evaluation this date, POD#0 from above surgery. Pt was independent in all ADL and working prior to surgery and is eager to return to PLOF with less pain and improved safety and independence. Pt currently requires minimal assist for LB dressing and bathing while in seated position due to pain and limited AROM of L knee. Pt instructed in polar care mgt, falls prevention strategies, home/routines modifications, DME/AE for LB bathing and dressing tasks, pet care considerations,and compression stocking mgt. Handout provided to support recall and carryover. Pt would benefit from skilled OT services including additional instruction in dressing techniques with or without assistive devices for dressing and bathing skills to support recall and carryover prior to discharge and ultimately to maximize safety, independence, and minimize falls risk and caregiver burden. Do not currently anticipate any OT needs following this hospitalization.      Follow Up Recommendations  No OT follow up    Equipment Recommendations  3 in 1 bedside commode    Recommendations for Other Services       Precautions / Restrictions Precautions Precautions: Knee Precaution Booklet Issued: Yes (comment) Restrictions Weight Bearing Restrictions: Yes LLE Weight Bearing: Weight bearing as tolerated      Mobility Bed Mobility Overal bed mobility: Modified Independent Bed Mobility: Supine to Sit;Sit to Supine     Supine to sit: Modified independent (Device/Increase time) Sit to supine: Modified independent (Device/Increase time)   General bed mobility comments: no physical assistance or cueing required. Extra time  and effort. Mangement of lines and leads needed  Transfers Overall transfer level: Needs assistance Equipment used: Rolling walker (2 wheeled) Transfers: Sit to/from Stand Sit to Stand: Min guard         General transfer comment: No physical assistance needed. Pt mostly used RLE for transfer and didn't WB through the LLE very much until after standing was achieved    Balance Overall balance assessment: Needs assistance Sitting-balance support: No upper extremity supported;Feet unsupported Sitting balance-Leahy Scale: Good     Standing balance support: Bilateral upper extremity supported;During functional activity Standing balance-Leahy Scale: Fair Standing balance comment: In static standing, pt did not require UE assist to maintain upright posture. During amb, pt required mod A through the RW                           ADL either performed or assessed with clinical judgement   ADL Overall ADL's : Needs assistance/impaired                                       General ADL Comments: Min A for LB ADL tasks 2/2 decreased ROM and pain in L knee. Mod assist for compression stocking mgt. CGA for toilet transfer - pt report family able to provide needed level of assist     Vision Baseline Vision/History: Wears glasses Patient Visual Report: No change from baseline       Perception     Praxis      Pertinent Vitals/Pain Pain Assessment: 0-10 Pain Score: 7  Pain Location: distal  L quad Pain Descriptors / Indicators: Aching;Sore Pain Intervention(s): Limited activity within patient's tolerance;Monitored during session;Repositioned;RN gave pain meds during session;Ice applied     Hand Dominance     Extremity/Trunk Assessment Upper Extremity Assessment Upper Extremity Assessment: Overall WFL for tasks assessed   Lower Extremity Assessment Lower Extremity Assessment: Defer to PT evaluation;LLE deficits/detail LLE Deficits / Details: expected  post-op strength/ROM deficits LLE: Unable to fully assess due to pain LLE Sensation: WNL       Communication Communication Communication: No difficulties   Cognition Arousal/Alertness: Awake/alert Behavior During Therapy: WFL for tasks assessed/performed Overall Cognitive Status: Within Functional Limits for tasks assessed                                     General Comments       Exercises Total Joint Exercises Ankle Circles/Pumps: AROM;Strengthening;Both;10 reps Quad Sets: Strengthening;Both;10 reps Gluteal Sets: Strengthening;Both;10 reps Heel Slides: 10 reps;AROM;Right;AAROM;Left Hip ABduction/ADduction: 10 reps;AROM;Right;AAROM;Left Straight Leg Raises: 10 reps;AROM;Right;AAROM;Left Long Arc Quad: AROM;Strengthening;Both;10 reps Knee Flexion: AROM;Strengthening;Both;10 reps Goniometric ROM: L knee AROM: 10-88 deg Marching in Standing: AROM;Strengthening;Both;10 reps Other Exercises Other Exercises: HEP education Other Exercises: Education on WB status Other Exercises: Education on LLE position with transfers Other Exercises: Pt instructed in L knee positioning while in bed or chair to support maximal knee extension for healing, polar care mgt, compression stocking mgt, AE/DME for ADL, pet care considerations, and home/routines modifications. Handout provided to support recall and carryover.   Shoulder Instructions      Home Living Family/patient expects to be discharged to:: Private residence Living Arrangements: Spouse/significant other;Children Available Help at Discharge: Family;Available 24 hours/day Type of Home: House Home Access: Stairs to enter [employer] of Steps: 3 Entrance Stairs-Rails: Right;Left;Can reach both Home Layout: One level     Bathroom Shower/Tub: Teacher, early years/pre: Standard     Home Equipment: None          Prior Functioning/Environment Level of Independence: Independent         Comments: Per pt: ind community amb, no AD use, ind with ADL's, denies falls in past six months        OT Problem List: Decreased strength;Pain;Decreased range of motion;Decreased knowledge of use of DME or AE      OT Treatment/Interventions: Self-care/ADL training;Therapeutic exercise;Therapeutic activities;DME and/or AE instruction;Patient/family education    OT Goals(Current goals can be found in the care plan section) Acute Rehab OT Goals Patient Stated Goal: "get back to at least where I was before surgery" OT Goal Formulation: With patient Time For Goal Achievement: 10/07/19 Potential to Achieve Goals: Good ADL Goals Pt Will Perform Lower Body Dressing: with supervision;sit to/from stand;with adaptive equipment Pt Will Transfer to Toilet: with supervision;ambulating;bedside commode(LRAD for amb) Additional ADL Goal #1: Pt will independently instruct family in compression stocking mgt Additional ADL Goal #2: Pt will independently instruct family in polar care mgt  OT Frequency: Min 1X/week   Barriers to D/C:            Co-evaluation              AM-PAC OT "6 Clicks" Daily Activity     Outcome Measure Help from another person eating meals?: None Help from another person taking care of personal grooming?: None Help from another person toileting, which includes using toliet, bedpan, or urinal?: A Little Help from another person bathing (including washing, rinsing, drying)?:  A Little Help from another person to put on and taking off regular upper body clothing?: None Help from another person to put on and taking off regular lower body clothing?: A Little 6 Click Score: 21   End of Session    Activity Tolerance: Patient tolerated treatment well Patient left: in bed;with call bell/phone within reach;with bed alarm set;with SCD's reapplied;Other (comment)(polar care in place)  OT Visit Diagnosis: Other abnormalities of gait and mobility (R26.89);Pain Pain -  Right/Left: Left Pain - part of body: Knee                Time: 1540-1610 OT Time Calculation (min): 30 min Charges:  OT General Charges $OT Visit: 1 Visit OT Evaluation $OT Eval Moderate Complexity: 1 Mod OT Treatments $Self Care/Home Management : 23-37 mins  Jeni Salles, MPH, MS, OTR/L ascom [phone] 09/23/19, 4:23 PM

## 2019-09-24 LAB — BASIC METABOLIC PANEL
Anion gap: 6 (ref 5–15)
BUN: 11 mg/dL (ref 6–20)
CO2: 27 mmol/L (ref 22–32)
Calcium: 8.3 mg/dL — ABNORMAL LOW (ref 8.9–10.3)
Chloride: 103 mmol/L (ref 98–111)
Creatinine, Ser: 0.68 mg/dL (ref 0.44–1.00)
GFR calc Af Amer: >60 mL/min (ref >60)
GFR calc non Af Amer: >60 mL/min (ref >60)
Glucose, Bld: 97 mg/dL (ref 70–99)
Potassium: 4 mmol/L (ref 3.5–5.1)
Sodium: 136 mmol/L (ref 135–145)

## 2019-09-24 LAB — GLUCOSE, CAPILLARY: Glucose-Capillary: 102 mg/dL — ABNORMAL HIGH (ref 70–99)

## 2019-09-24 LAB — CBC
HCT: 35.3 % — ABNORMAL LOW (ref 36.0–46.0)
Hemoglobin: 11.8 g/dL — ABNORMAL LOW (ref 12.0–15.0)
MCH: 31.3 pg (ref 26.0–34.0)
MCHC: 33.4 g/dL (ref 30.0–36.0)
MCV: 93.6 fL (ref 80.0–100.0)
Platelets: 249 10*3/uL (ref 150–400)
RBC: 3.77 MIL/uL — ABNORMAL LOW (ref 3.87–5.11)
RDW: 13 % (ref 11.5–15.5)
WBC: 13.7 10*3/uL — ABNORMAL HIGH (ref 4.0–10.5)
nRBC: 0 % (ref 0.0–0.2)

## 2019-09-24 MED ORDER — MAGNESIUM HYDROXIDE 400 MG/5ML PO SUSP
30.0000 mL | Freq: Once | ORAL | Status: AC
  Administered 2019-09-24: 30 mL via ORAL
  Filled 2019-09-24: qty 30

## 2019-09-24 NOTE — Progress Notes (Signed)
Physical Therapy Treatment Patient Details Name: Brooke Molina MRN: [id number] DOB: 11-14-1975 Today's Date: 09/24/2019    History of Present Illness Per MD notes: Pt is a 44 y/o F with L knee OA and is s/p elective L TKA. PMH includes: castleman disease and OA    PT Comments    Pt pleasant and motivated to participate during the session. Pt had good carryover with HEP and transfer training from yesterday and demonstrated good understanding of gait training education. Pt was able to ambulate 150 feet without physical assistance. Pt self-limited ambulation secondary to increased pain but pain levels quickly returned to baseline once sitting back down. Pt will benefit from HHPT services upon discharge to safely address deficits listed in patient problem list for decreased caregiver assistance and eventual return to PLOF.       Follow Up Recommendations  Home health PT     Equipment Recommendations  Rolling walker with 5" wheels;3in1 (PT)    Recommendations for Other Services       Precautions / Restrictions Precautions Precautions: Knee Precaution Booklet Issued: Yes (comment) Restrictions Weight Bearing Restrictions: Yes LLE Weight Bearing: Weight bearing as tolerated    Mobility  Bed Mobility Overal bed mobility: Modified Independent Bed Mobility: Supine to Sit;Sit to Supine     Supine to sit: Modified independent (Device/Increase time) Sit to supine: Modified independent (Device/Increase time)   General bed mobility comments: no physical assistance or cueing required. Extra time and effort.  Transfers Overall transfer level: Needs assistance Equipment used: Rolling walker (2 wheeled) Transfers: Sit to/from Stand Sit to Stand: Supervision         General transfer comment: No physical assistance needed. Cueing provided once for L foot placement for stand-to-sit transfer  Ambulation/Gait Ambulation/Gait assistance: Min guard Gait Distance (Feet): 150 Feetx1;  10 Feet x1 Assistive device: Rolling walker (2 wheeled) Gait Pattern/deviations: Decreased step length - right;Step-to pattern;Step-through pattern;Decreased stance time - left;Decreased weight shift to left;Antalgic Gait velocity: decreased   General Gait Details: During first ambulation bout of 10 feet, pt used a step-to pattern with mod BUE assist through the RW. Pt also had decreased WB through the LLE that caused decreased step length with the RLE. After verbal and visual demonstartion/education about gait patterns, pt was able to ambulate 150 feet with a step-through pattern, light BUE assist through the RW, and improved WB on the L and step length on the R   Stairs             Wheelchair Mobility    Modified Rankin (Stroke Patients Only)       Balance Overall balance assessment: Needs assistance Sitting-balance support: No upper extremity supported;Feet unsupported Sitting balance-Leahy Scale: Normal Sitting balance - Comments: Pt can weight shift outside of her BOS to reach objects with no balance deficits noted   Standing balance support: Bilateral upper extremity supported;During functional activity Standing balance-Leahy Scale: Good Standing balance comment: In static standing, pt did not require UE assist to maintain upright posture. During amb, pt required mod A through the RW initially but was able to progress to min A as she continued to amb                            Cognition Arousal/Alertness: Awake/alert Behavior During Therapy: WFL for tasks assessed/performed Overall Cognitive Status: Within Functional Limits for tasks assessed  Exercises Total Joint Exercises Ankle Circles/Pumps: AROM;Strengthening;Both;10 reps Quad Sets: Strengthening;Both;10 reps Gluteal Sets: Strengthening;Both;10 reps Heel Slides: 10 reps;AROM;Both Hip ABduction/ADduction: 10 reps;AROM;Both Straight Leg Raises:  10 reps;AROM;Both Long Arc Quad: AROM;Strengthening;Both;10 reps Knee Flexion: AROM;Strengthening;Both;10 reps Goniometric ROM: L knee AROM: 7-84 deg Marching in Standing: AROM;Strengthening;Both;10 reps Other Exercises Other Exercises: HEP education and review Other Exercises: Gait training Other Exercises: Education on LLE position with transfers    General Comments        Pertinent Vitals/Pain Pain Assessment: 0-10 Pain Score: 6  Pain Location: L thigh and knee Pain Descriptors / Indicators: Aching;Sore Pain Intervention(s): Monitored during session;RN gave pain meds during session    Home Living                      Prior Function            PT Goals (current goals can now be found in the care plan section) Progress towards PT goals: Progressing toward goals    Frequency    BID      PT Plan Current plan remains appropriate    Co-evaluation              AM-PAC PT "6 Clicks" Mobility   Outcome Measure  Help needed turning from your back to your side while in a flat bed without using bedrails?: None Help needed moving from lying on your back to sitting on the side of a flat bed without using bedrails?: None Help needed moving to and from a bed to a chair (including a wheelchair)?: A Little Help needed standing up from a chair using your arms (e.g., wheelchair or bedside chair)?: A Little Help needed to walk in hospital room?: A Little Help needed climbing 3-5 steps with a railing? : A Little 6 Click Score: 20    End of Session Equipment Utilized During Treatment: Gait belt Activity Tolerance: Patient tolerated treatment well Patient left: in bed;with call bell/phone within reach;with bed alarm set;with SCD's reapplied Nurse Communication: Mobility status;Precautions;Weight bearing status PT Visit Diagnosis: Unsteadiness on feet (R26.81);Other abnormalities of gait and mobility (R26.89);Muscle weakness (generalized) (M62.81);Pain Pain -  Right/Left: Left Pain - part of body: Knee     Time: [id number] PT Time Calculation (min) (ACUTE ONLY): 38 min  Charges:                        Annabelle Harman, SPT 09/24/19 10:27 AM

## 2019-09-24 NOTE — TOC Initial Note (Signed)
Transition of Care [facility]) - Initial/Assessment Note    Patient Details  Name: Brooke Molina MRN: [id number] Date of Birth: 1976-02-19  Transition of Care [facility]) CM/SW Contact:    Elease Hashimoto, LCSW Phone Number: 09/24/2019, 10:20 AM  Clinical Narrative:    Pt lives with her husband and son who will be available to assist if needed. Pt is hopeful she will be mod/i with rolling walker and is already doing well in therapies. Aware [facility] is set up to see her at home. She does need rw and 3 in 1 and have delivered to her room. Informed worker she is going home tomorrow. She has a PCP and transport home. Continue to follow if any other discharge needs.          Expected Discharge Plan: [place] Barriers to Discharge: Continued Medical Work up   Patient Goals and CMS Choice Patient states their goals for this hospitalization and ongoing recovery are:: I'm going home tomorrow      Expected Discharge Plan and Services Expected Discharge Plan: [place] In-house Referral: Clinical Social Work   Post Acute Care Choice: Durable Medical Equipment, Home Health Living arrangements for the past 2 months: Single Family Home                 DME Arranged: 3-N-1, Walker rolling DME Agency: [employer] Date DME Agency Contacted: 09/24/19 Time DME Agency Contacted: 18 Representative spoke with at DME Agency: [place]: PT [place]: [facility] (now [facility]) Date [place]: 09/24/19 Time [place]: 56 Representative spoke with at [place]: teresa  Prior Living Arrangements/Services Living arrangements for the past 2 months: [place] with:: Spouse, Adult Children          Need for Family Participation in Patient Care: No (Comment) Care giver support system in place?: Yes (comment)      Activities of Daily Living Home Assistive Devices/Equipment: Eyeglasses ADL Screening  (condition at time of admission) Patient's cognitive ability adequate to safely complete daily activities?: Yes Is the patient deaf or have difficulty hearing?: No Does the patient have difficulty seeing, even when wearing glasses/contacts?: No Does the patient have difficulty concentrating, remembering, or making decisions?: No Patient able to express need for assistance with ADLs?: Yes Does the patient have difficulty dressing or bathing?: No Independently performs ADLs?: Yes (appropriate for developmental age) Does the patient have difficulty walking or climbing stairs?: No Weakness of Legs: None Weakness of Arms/Hands: None  Permission Sought/Granted                  Emotional Assessment Appearance:: Appears stated age Attitude/Demeanor/Rapport: Engaged Affect (typically observed): Adaptable, Accepting Orientation: : Oriented to Self, Oriented to Place, Oriented to  Time, Oriented to Situation   Psych Involvement: No (comment)  Admission diagnosis:  Knee joint replacement status, left [Z96.652] Patient Active Problem List   Diagnosis Date Noted  . Knee joint replacement status, left 09/23/2019  . 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[place]) 10/02/2017  . Tobacco use 10/02/2017   PCP:  Donnamarie Rossetti, PA-C Pharmacy:   [facility] [phone] [address], [place] [address] Phone: [phone] Fax: [phone]     Social Determinants of Health (SDOH) Interventions    Readmission Risk Interventions No flowsheet data found.

## 2019-09-24 NOTE — Progress Notes (Signed)
   Subjective: 1 Day Post-Op Procedure(s) (LRB): LEFT TOTAL KNEE ARTHROPLASTY (Left) Patient reports pain as 6 on 0-10 scale.   Patient is well, and has had no acute complaints or problems Denies any CP, SOB, ABD pain. We will continue therapy today.  Plan is to go Home after hospital stay.  Objective: Vital signs in last 24 hours: Temp:  [97.1 F (36.2 C)-98.8 F (37.1 C)] 97.8 F (36.6 C) (03/24 0424) Pulse Rate:  [54-81] 58 (03/24 0424) Resp:  [13-22] 20 (03/24 0424) BP: (103-111)/(62-75) 108/62 (03/24 0424) SpO2:  [95 %-100 %] 97 % (03/24 0424)  Intake/Output from previous day: 03/23 0701 - 03/24 0700 In: 3525 [P.O.:125; I.V.:1300; IV Piggyback:300] Out: 375 [Urine:275; Blood:100] Intake/Output this shift: No intake/output data recorded.  Recent Labs    09/23/19 1049 09/24/19 0416  HGB 13.4 11.8*   Recent Labs    09/23/19 1049 09/24/19 0416  WBC 11.9* 13.7*  RBC 4.29 3.77*  HCT 40.5 35.3*  PLT 260 249   Recent Labs    09/23/19 1049 09/24/19 0416  NA  --  136  K  --  4.0  CL  --  103  CO2  --  27  BUN  --  11  CREATININE 0.61 0.68  GLUCOSE  --  97  CALCIUM  --  8.3*   No results for input(s): LABPT, INR in the last 72 hours.  EXAM General - Patient is Alert, Appropriate and Oriented Extremity - Neurovascular intact Sensation intact distally Intact pulses distally Dorsiflexion/Plantar flexion intact No cellulitis present Compartment soft Dressing - dressing C/D/I and scant drainage Motor Function - intact, moving foot and toes well on exam.   Past Medical History:  Diagnosis Date  . 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 Osteoarthritis    bilateral knees    Assessment/Plan:   1 Day Post-Op Procedure(s) (LRB): LEFT TOTAL KNEE ARTHROPLASTY (Left) Active Problems:   Knee joint replacement status, left  Estimated body mass index is 33.85 kg/m as calculated from the following:   Height as of this encounter: 5\' 10"  (1.778 m).   Weight as of this  encounter: 107 kg. Advance diet Up with therapy  Needs BM Labs stable VSS Pain controlled Care management to assist with discharge to home with home health PT   DVT Prophylaxis - Lovenox, Foot Pumps and TED hose Weight-Bearing as tolerated to left leg   T. Rachelle Hora, PA-C [place] 09/24/2019, 7:30 AM

## 2019-09-24 NOTE — Progress Notes (Signed)
Physical Therapy Treatment Patient Details Name: Brooke Molina MRN: [id number] DOB: Mar 22, 1976 Today's Date: 09/24/2019    History of Present Illness Per MD notes: Pt is a 44 y/o F with L knee OA and is s/p elective L TKA. PMH includes: castleman disease and OA    PT Comments    Pt pleasant and motivated to participate during the session. Pt was able to ambulate 250 feet and complete stair training without physical assistance. Pt required cueing for step sequencing for stairs and cueing for increasing step length with amb. Pt had good carryover with the HEP and foot placement during transfers. Pt will benefit from HHPT services upon discharge to safely address deficits listed in patient problem list for decreased caregiver assistance and eventual return to PLOF.       Follow Up Recommendations  Home health PT     Equipment Recommendations  Rolling walker with 5" wheels;3in1 (PT)    Recommendations for Other Services       Precautions / Restrictions Precautions Precautions: Knee Precaution Booklet Issued: Yes (comment) Restrictions Weight Bearing Restrictions: Yes LLE Weight Bearing: Weight bearing as tolerated    Mobility  Bed Mobility Overal bed mobility: Modified Independent Bed Mobility: Supine to Sit;Sit to Supine     Supine to sit: Modified independent (Device/Increase time);HOB elevated Sit to supine: Modified independent (Device/Increase time);HOB elevated   General bed mobility comments: use of bed rail, HOB elevated  Transfers Overall transfer level: Needs assistance Equipment used: Rolling walker (2 wheeled) Transfers: Sit to/from Stand Sit to Stand: Supervision          Ambulation/Gait Ambulation/Gait assistance: Supervision Gait Distance (Feet): 250 Feet Assistive device: Rolling walker (2 wheeled) Gait Pattern/deviations: Decreased step length - right;Step-to pattern;Step-through pattern;Decreased stance time - left;Decreased weight shift to  left;Antalgic Gait velocity: decreased   General Gait Details: Pt increased step length as she ambulated but when she became fatigued, she decreased step legnth and reverted back to a step-to pattern with limited weight shifting to the L which resulted in decreased R step length   Stairs Stairs: Yes Stairs assistance: Min guard Stair Management: Two rails;Step to pattern;Forwards Number of Stairs: 4 stepsx2; 1 stepx3 General stair comments: Pt provided with education on sequencing and was able to demonstrate the stairs with one error that required cueing to correct   Wheelchair Mobility    Modified Rankin (Stroke Patients Only)       Balance Overall balance assessment: Needs assistance Sitting-balance support: No upper extremity supported;Feet unsupported Sitting balance-Leahy Scale: Normal Sitting balance - Comments: Pt can weight shift outside of her BOS to reach objects with no balance deficits noted   Standing balance support: Bilateral upper extremity supported;During functional activity Standing balance-Leahy Scale: Good Standing balance comment: Pt can stand upright without UE support                            Cognition Arousal/Alertness: Awake/alert Behavior During Therapy: WFL for tasks assessed/performed Overall Cognitive Status: Within Functional Limits for tasks assessed                                        Exercises Total Joint Exercises Ankle Circles/Pumps: AROM;Strengthening;Both;10 reps Quad Sets: Strengthening;Both;10 reps Gluteal Sets: Strengthening;Both;10 reps Heel Slides: 10 reps;AROM;Both Hip ABduction/ADduction: 10 reps;AROM;Both Straight Leg Raises: 10 reps;AROM;Both Long Arc Quad: AROM;Strengthening;Both;10 reps Knee Flexion:  AROM;Strengthening;Both;10 reps Marching in Standing: AROM;Strengthening;Both;10 reps Other Exercises Other Exercises: Stair transfer sequence training    General Comments         Pertinent Vitals/Pain Pain Assessment: 0-10 Pain Score: 6  Pain Location: L thigh and knee Pain Descriptors / Indicators: Aching;Sore Pain Intervention(s): Monitored during session;Premedicated before session    Home Living                      Prior Function            PT Goals (current goals can now be found in the care plan section) Acute Rehab PT Goals Progress towards PT goals: Progressing toward goals    Frequency    BID      PT Plan Current plan remains appropriate    Co-evaluation              AM-PAC PT "6 Clicks" Mobility   Outcome Measure  Help needed turning from your back to your side while in a flat bed without using bedrails?: None Help needed moving from lying on your back to sitting on the side of a flat bed without using bedrails?: None Help needed moving to and from a bed to a chair (including a wheelchair)?: A Little Help needed standing up from a chair using your arms (e.g., wheelchair or bedside chair)?: A Little Help needed to walk in hospital room?: A Little Help needed climbing 3-5 steps with a railing? : A Little 6 Click Score: 20    End of Session Equipment Utilized During Treatment: Gait belt Activity Tolerance: Patient tolerated treatment well Patient left: in bed;with call bell/phone within reach;with bed alarm set;with SCD's reapplied; [place] care applied Nurse Communication: Mobility status;Precautions;Weight bearing status PT Visit Diagnosis: Unsteadiness on feet (R26.81);Other abnormalities of gait and mobility (R26.89);Muscle weakness (generalized) (M62.81);Pain Pain - Right/Left: Left Pain - part of body: Knee     Time: 1440-1510 PT Time Calculation (min) (ACUTE ONLY): 30 min  Charges:                        Annabelle Harman, SPT 09/24/19 3:33 PM

## 2019-09-24 NOTE — Progress Notes (Signed)
Occupational Therapy Treatment Patient Details Name: Brooke Molina MRN: [id number] DOB: 08-24-1975 Today's Date: 09/24/2019    History of present illness Per MD notes: Pt is a 44 y/o F with L knee OA and is s/p elective L TKA. PMH includes: castleman disease and OA   OT comments  Pt seen for OT tx this date to f/u re: safety with self care ADLs/ADL mobility. Pt requests to use restroom when OT presents. Pt demos poor hand placement initially for sit to stand with RW. Pt requires CGA and MIN verbal cues for hand placement. Pt demos appropriate hand placement with ADL transfers after that. Pt t/f to commode with CGA to SBA and off commode with SBA with use of grab bar. Performs standing hand hygiene using RW for balance with SBA. Pt performs fxl mobility to opposite side of room to chair and demos good hand placement for stand to sit to chair. Pt requires MIN A to CGA for seated LB dressing ADLs, demonstrating improvement, using modified technique, flexing L hip to reach root to avoid bending knee/elliciting knee pain. OT still offers f/u re: LB ADL AE for dressing/bathing should pt need/choose to use for increased comfort. Pt with decreased c/o pain this date and demos good understanding of safety and AE for LB ADL education. Do not anticipate need for OT f/u after acute stay.    Follow Up Recommendations  No OT follow up    Equipment Recommendations  3 in 1 bedside commode    Recommendations for Other Services      Precautions / Restrictions Precautions Precautions: Knee Precaution Booklet Issued: Yes (comment) Restrictions Weight Bearing Restrictions: Yes LLE Weight Bearing: Weight bearing as tolerated       Mobility Bed Mobility Overal bed mobility: Modified Independent Bed Mobility: Supine to Sit     Supine to sit: Modified independent (Device/Increase time) Sit to supine: Modified independent (Device/Increase time)   General bed mobility comments: use of bed rail, HOB  elevated  Transfers Overall transfer level: Needs assistance Equipment used: Rolling walker (2 wheeled) Transfers: Sit to/from Stand Sit to Stand: Supervision;Min guard         General transfer comment: cueing for safe hand placement relative to RW with sit to stand, demos good hand placement for stand to sit    Balance Overall balance assessment: Needs assistance Sitting-balance support: No upper extremity supported;Feet unsupported Sitting balance-Leahy Scale: Normal Sitting balance - Comments: Pt can weight shift outside of her BOS to reach objects with no balance deficits noted   Standing balance support: Bilateral upper extremity supported;During functional activity Standing balance-Leahy Scale: Good Standing balance comment: Use of UEs on RW, initially CGA, but ultimately SBA with RW for fxl mobility                           ADL either performed or assessed with clinical judgement   ADL Overall ADL's : Needs assistance/impaired                                       General ADL Comments: Pt completes fxl mobility with RW to restroom with CGA. Commode transfer with use of grab bar with CGA and MIN cues for hand placement. Peri care with sit/lateral lean with SBA. Standing hand hygiene with RW with SBA.     Vision Baseline Vision/History: Wears glasses Patient Visual Report:  No change from baseline     Perception     Praxis      Cognition Arousal/Alertness: Awake/alert Behavior During Therapy: WFL for tasks assessed/performed Overall Cognitive Status: Within Functional Limits for tasks assessed                                          Exercises Other Exercises Other Exercises: OT facilitates re-education re: self care LB ADLs with AE. Pt demos good LE flexibility and can complete some aspects of seated LB ADLs with extended time and flexing at the hip to reach foot. Other Exercises: OT facilitates education re: safe  hand placement with RW for ADL transfers and pt demos good understanding. Other Exercises: Education on LLE position with transfers   Shoulder Instructions       General Comments      Pertinent Vitals/ Pain       Pain Assessment: Faces Pain Score: 6  Faces Pain Scale: Hurts a little bit Pain Location: L thigh and knee Pain Descriptors / Indicators: Aching;Sore Pain Intervention(s): Monitored during session;Repositioned(pt from bed to chair during session.)  Home Living                                          Prior Functioning/Environment              Frequency  Min 1X/week        Progress Toward Goals  OT Goals(current goals can now be found in the care plan section)  Progress towards OT goals: Progressing toward goals  Acute Rehab OT Goals Patient Stated Goal: "get back to at least where I was before surgery" OT Goal Formulation: With patient Time For Goal Achievement: 10/07/19 Potential to Achieve Goals: Good  Plan Discharge plan remains appropriate    Co-evaluation                 AM-PAC OT "6 Clicks" Daily Activity     Outcome Measure   Help from another person eating meals?: None Help from another person taking care of personal grooming?: None Help from another person toileting, which includes using toliet, bedpan, or urinal?: A Little   Help from another person to put on and taking off regular upper body clothing?: None Help from another person to put on and taking off regular lower body clothing?: A Little 6 Click Score: 18    End of Session Equipment Utilized During Treatment: Gait belt;Rolling walker  OT Visit Diagnosis: Other abnormalities of gait and mobility (R26.89);Pain Pain - Right/Left: Left Pain - part of body: Knee   Activity Tolerance Patient tolerated treatment well   Patient Left with call bell/phone within reach;in chair;with chair alarm set   Nurse Communication Mobility status        Time:  [id number] OT Time Calculation (min): 23 min  Charges: OT General Charges $OT Visit: 1 Visit OT Treatments $Self Care/Home Management : 8-22 mins $Therapeutic Activity: 8-22 mins  Gerrianne Scale, MS, OTR/L ascom [phone] 09/24/19, 1:20 PM

## 2019-09-24 NOTE — Anesthesia Postprocedure Evaluation (Signed)
Anesthesia Post Note  Patient: Brooke Molina  Procedure(s) Performed: LEFT TOTAL KNEE ARTHROPLASTY (Left Knee)  Patient location during evaluation: Nursing Unit Anesthesia Type: Spinal Level of consciousness: oriented and awake and alert Pain management: pain level controlled Vital Signs Assessment: post-procedure vital signs reviewed and stable Respiratory status: spontaneous breathing and respiratory function stable Cardiovascular status: blood pressure returned to baseline and stable Postop Assessment: no headache, no backache, no apparent nausea or vomiting and patient able to bend at knees Anesthetic complications: no     Last Vitals:  Vitals:   09/23/19 2359 09/24/19 0424  BP: 103/66 108/62  Pulse: (!) 54 (!) 58  Resp: 18 20  Temp: 36.8 C 36.6 C  SpO2: 98% 97%    Last Pain:  Vitals:   09/24/19 0606  TempSrc:   PainSc: 7                  Alison Stalling

## 2019-09-25 MED ORDER — ENOXAPARIN SODIUM 40 MG/0.4ML ~~LOC~~ SOLN: 40.0000 mg | SUBCUTANEOUS | 0 refills | Status: DC

## 2019-09-25 MED ORDER — DOCUSATE SODIUM 100 MG PO CAPS: 100.0000 mg | ORAL_CAPSULE | Freq: Two times a day (BID) | ORAL | 0 refills | Status: DC

## 2019-09-25 MED ORDER — METHOCARBAMOL 500 MG PO TABS: 500.0000 mg | ORAL_TABLET | Freq: Four times a day (QID) | ORAL | 0 refills | Status: DC | PRN

## 2019-09-25 MED ORDER — OXYCODONE HCL 5 MG PO TABS: 5.0000 mg | ORAL_TABLET | ORAL | 0 refills | Status: DC | PRN

## 2019-09-25 MED ORDER — MAGNESIUM HYDROXIDE 400 MG/5ML PO SUSP
30.0000 mL | Freq: Once | ORAL | Status: AC
  Administered 2019-09-25: 30 mL via ORAL
  Filled 2019-09-25: qty 30

## 2019-09-25 NOTE — Progress Notes (Signed)
Physical Therapy Treatment Patient Details Name: Brooke Molina MRN: [id number] DOB: 1976/03/08 Today's Date: 09/25/2019    History of Present Illness Per MD notes: Pt is a 44 y/o F with L knee OA and is s/p elective L TKA. PMH includes: castleman disease and OA    PT Comments    Pt pleasant and motivated to participate during the session. Pt had good carryover in regards to movement strategies and sequencing related to transfers, amb, RW use, HEP, and stairs. Pt was more limited by pain this morning than she was yesterday. Pt had increased difficulty with AROM therex along with decreased step length with amb. Despite increased pain, pt was still able to complete all therex, amb a total of 70', and complete stair training without physical assistance. Pt will benefit from HHPT services upon discharge to safely address deficits listed in patient problem list for decreased caregiver assistance and eventual return to PLOF.       Follow Up Recommendations  Home health PT     Equipment Recommendations  Rolling walker with 5" wheels;3in1 (PT)    Recommendations for Other Services       Precautions / Restrictions Precautions Precautions: Knee Precaution Booklet Issued: Yes (comment) Restrictions Weight Bearing Restrictions: Yes LLE Weight Bearing: Weight bearing as tolerated    Mobility  Bed Mobility Overal bed mobility: Modified Independent Bed Mobility: Supine to Sit;Sit to Supine     Supine to sit: Modified independent (Device/Increase time);HOB elevated Sit to supine: Modified independent (Device/Increase time);HOB elevated   General bed mobility comments: inc time and effort. Some assistance from non-surgical leg to move surgical leg  Transfers Overall transfer level: Needs assistance Equipment used: Rolling walker (2 wheeled) Transfers: Sit to/from Stand Sit to Stand: Supervision         General transfer comment: Good carryover with foot placement during  transfers  Ambulation/Gait Ambulation/Gait assistance: Supervision Gait Distance (Feet): 50 Feetx1; 10 Feet x2 Assistive device: Rolling walker (2 wheeled) Gait Pattern/deviations: Decreased step length - right;Step-to pattern;Decreased stance time - left;Decreased weight shift to left;Antalgic Gait velocity: decreased   General Gait Details: Pt used a step-to pattern this morning with a decreased R step length and limited weight shifting to the L   Stairs   Stairs assistance: Min guard Stair Management: Two rails;Step to pattern;Forwards Number of Stairs: 4 General stair comments: Good carryover for sequencing   Wheelchair Mobility    Modified Rankin (Stroke Patients Only)       Balance Overall balance assessment: Needs assistance Sitting-balance support: No upper extremity supported;Feet unsupported Sitting balance-Leahy Scale: Normal Sitting balance - Comments: Pt can weight shift outside of her BOS to reach objects with no balance deficits noted   Standing balance support: Bilateral upper extremity supported;During functional activity Standing balance-Leahy Scale: Good Standing balance comment: Pt can stand upright without UE support but uses mod assist through the RW with amb                            Cognition Arousal/Alertness: Awake/alert Behavior During Therapy: WFL for tasks assessed/performed Overall Cognitive Status: Within Functional Limits for tasks assessed                                        Exercises Total Joint Exercises Ankle Circles/Pumps: AROM;Strengthening;Both;10 reps Quad Sets: Strengthening;Both;10 reps Gluteal Sets: Strengthening;Both;10 reps Heel Slides:  10 reps;AROM;Both Hip ABduction/ADduction: 10 reps;AROM;Both Straight Leg Raises: 10 reps;AROM;Both Long Arc Quad: AROM;Strengthening;Both;10 reps Knee Flexion: AROM;Strengthening;Both;10 reps Goniometric ROM: L knee AROM: 3-82 deg Marching in Standing:  AROM;Strengthening;Both;10 reps Other Exercises Other Exercises: Reviewed stair and transfer sequencing Other Exercises: Reviewed HEP and mobility frequency post D/c Other Exercises: visually simulated car transfers    General Comments        Pertinent Vitals/Pain Pain Assessment: 0-10 Pain Score: 6  Pain Location: L knee Pain Descriptors / Indicators: Aching;Sore Pain Intervention(s): Monitored during session;Premedicated before session;Ice applied    Home Living                      Prior Function            PT Goals (current goals can now be found in the care plan section) Progress towards PT goals: Progressing toward goals    Frequency    BID      PT Plan Current plan remains appropriate    Co-evaluation              AM-PAC PT "6 Clicks" Mobility   Outcome Measure  Help needed turning from your back to your side while in a flat bed without using bedrails?: None Help needed moving from lying on your back to sitting on the side of a flat bed without using bedrails?: None Help needed moving to and from a bed to a chair (including a wheelchair)?: A Little Help needed standing up from a chair using your arms (e.g., wheelchair or bedside chair)?: A Little Help needed to walk in hospital room?: A Little Help needed climbing 3-5 steps with a railing? : A Little 6 Click Score: 20    End of Session Equipment Utilized During Treatment: Gait belt Activity Tolerance: Patient tolerated treatment well Patient left: in bed;with call bell/phone within reach;with bed alarm set;with SCD's reapplied Nurse Communication: Mobility status;Precautions;Weight bearing status PT Visit Diagnosis: Unsteadiness on feet (R26.81);Other abnormalities of gait and mobility (R26.89);Muscle weakness (generalized) (M62.81);Pain Pain - Right/Left: Left Pain - part of body: Knee     Time: [id number] PT Time Calculation (min) (ACUTE ONLY): 41 min  Charges:                         Annabelle Harman, SPT 09/25/19 12:21 PM

## 2019-09-25 NOTE — Progress Notes (Signed)
   Subjective: 2 Days Post-Op Procedure(s) (LRB): LEFT TOTAL KNEE ARTHROPLASTY (Left) Patient reports pain as 8 on 0-10 scale.   Patient is well, and has had no acute complaints or problems. Mild Nausea Denies any CP, SOB, ABD pain. We will continue therapy today.  Plan is to go Home after hospital stay.  Objective: Vital signs in last 24 hours: Temp:  [97.9 F (36.6 C)-98.1 F (36.7 C)] 98 F (36.7 C) (03/25 0752) Pulse Rate:  [54-57] 55 (03/25 0752) Resp:  [16-20] 18 (03/25 0422) BP: (96-115)/(61-71) 115/61 (03/25 0752) SpO2:  [95 %-97 %] 95 % (03/25 0752)  Intake/Output from previous day: No intake/output data recorded. Intake/Output this shift: No intake/output data recorded.  Recent Labs    09/23/19 1049 09/24/19 0416  HGB 13.4 11.8*   Recent Labs    09/23/19 1049 09/24/19 0416  WBC 11.9* 13.7*  RBC 4.29 3.77*  HCT 40.5 35.3*  PLT 260 249   Recent Labs    09/23/19 1049 09/24/19 0416  NA  --  136  K  --  4.0  CL  --  103  CO2  --  27  BUN  --  11  CREATININE 0.61 0.68  GLUCOSE  --  97  CALCIUM  --  8.3*   No results for input(s): LABPT, INR in the last 72 hours.  EXAM General - Patient is Alert, Appropriate and Oriented Extremity - Neurovascular intact Sensation intact distally Intact pulses distally Dorsiflexion/Plantar flexion intact No cellulitis present Compartment soft Dressing - dressing C/D/I and moderate drainage. No active drainage Motor Function - intact, moving foot and toes well on exam.   Past Medical History:  Diagnosis Date  . 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 Osteoarthritis    bilateral knees    Assessment/Plan:   2 Days Post-Op Procedure(s) (LRB): LEFT TOTAL KNEE ARTHROPLASTY (Left) Active Problems:   Knee joint replacement status, left  Estimated body mass index is 33.85 kg/m as calculated from the following:   Height as of this encounter: 5\' 10"  (1.778 m).   Weight as of this encounter: 107 kg. Advance diet Up with  therapy  Needs BM Labs stable VSS Pain controlled Care management to assist with discharge to home with home health PT today pending good progress with PT   DVT Prophylaxis - Lovenox, Foot Pumps and TED hose Weight-Bearing as tolerated to left leg   T. Rachelle Hora, PA-C [place] 09/25/2019, 8:22 AM

## 2019-09-25 NOTE — Discharge Summary (Signed)
Physician Discharge Summary  Patient ID: Brooke Molina MRN: [id number] DOB/AGE: 46-Jun-1977 45 y.o.  Admit date: 09/23/2019 Discharge date: 09/25/2019  Admission Diagnoses:  Knee joint replacement status, left [Z96.652]   Discharge Diagnoses: Patient Active Problem List   Diagnosis Date Noted  . Knee joint replacement status, left 09/23/2019  . 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 Tobacco use 10/02/2017    Past Medical History:  Diagnosis Date  . 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 Osteoarthritis    bilateral knees     Transfusion: none   Consultants (if any):   Discharged Condition: Improved  Hospital Course: Brooke Molina is an 44 y.o. female who was admitted 09/23/2019 with a diagnosis of left knee osteoarthritis and went to the operating room on 09/23/2019 and underwent the above named procedures.    Surgeries: Procedure(s): LEFT TOTAL KNEE ARTHROPLASTY on 09/23/2019 Patient tolerated the surgery well. Taken to PACU where she was stabilized and then transferred to the orthopedic floor.  Started on Lovenox 30 mg q 12 hrs. Foot pumps applied bilaterally at 80 mm. Heels elevated on bed with rolled towels. No evidence of DVT. Negative [place]. Physical therapy started on day #1 for gait training and transfer. OT started day #1 for ADL and assisted devices.  Patient's foley was d/c on day #1. Patient's IV  was d/c on day #2.  On post op day #2 patient was stable and ready for discharge to home with HHPT.  Implants: Medacta GMK Sphere 3+ femur, 3 tibia baseplate with short stem and 10 mm insert, 2 patella, all components cemented  She was given perioperative antibiotics:  Anti-infectives (From admission, onward)   Start     Dose/Rate Route Frequency Ordered Stop   09/23/19 1330  ceFAZolin (ANCEF) IVPB 2g/100 mL premix     2 g 200 mL/hr over 30 Minutes Intravenous Every 6 hours 09/23/19 1035 09/24/19 0042   09/23/19 0620  ceFAZolin (ANCEF) 2-4 GM/100ML-% IVPB    Note to  Pharmacy: Myles Lipps   : cabinet override      09/23/19 0620 09/23/19 0733   09/23/19 0615  ceFAZolin (ANCEF) IVPB 2g/100 mL premix     2 g 200 mL/hr over 30 Minutes Intravenous On call to O.R. 09/23/19 [id number] 09/23/19 0730    .  She was given sequential compression devices, early ambulation, and Lovenox TEDs for DVT prophylaxis.  She benefited maximally from the hospital stay and there were no complications.    Recent vital signs:  Vitals:   09/25/19 0422 09/25/19 0752  BP: 96/66 115/61  Pulse: (!) 57 (!) 55  Resp: 18   Temp: 98.1 F (36.7 C) 98 F (36.7 C)  SpO2: 95% 95%    Recent laboratory studies:  Lab Results  Component Value Date   HGB 11.8 (L) 09/24/2019   HGB 13.4 09/23/2019   HGB 13.6 09/16/2019   Lab Results  Component Value Date   WBC 13.7 (H) 09/24/2019   PLT 249 09/24/2019   No results found for: INR Lab Results  Component Value Date   NA 136 09/24/2019   K 4.0 09/24/2019   CL 103 09/24/2019   CO2 27 09/24/2019   BUN 11 09/24/2019   CREATININE 0.68 09/24/2019   GLUCOSE 97 09/24/2019    Discharge Medications:   Allergies as of 09/25/2019   No Known Allergies     Medication List    STOP taking these medications   ibuprofen 200 MG tablet Commonly known as: ADVIL   naproxen  sodium 220 MG tablet Commonly known as: ALEVE     TAKE these medications   acetaminophen 500 MG tablet Commonly known as: TYLENOL Take 1,000 mg by mouth every 6 (six) hours as needed for mild pain or moderate pain.   docusate sodium 100 MG capsule Commonly known as: COLACE Take 1 capsule (100 mg total) by mouth 2 (two) times daily.   enoxaparin 40 MG/0.4ML injection Commonly known as: Lovenox Inject 0.4 mLs (40 mg total) into the skin daily for 14 days.   methocarbamol 500 MG tablet Commonly known as: ROBAXIN Take 1 tablet (500 mg total) by mouth every 6 (six) hours as needed for muscle spasms.   oxyCODONE 5 MG immediate release tablet Commonly known as:  Oxy IR/ROXICODONE Take 1-2 tablets (5-10 mg total) by mouth every 4 (four) hours as needed for moderate pain (pain score 4-6).   predniSONE 10 MG tablet Commonly known as: DELTASONE Take 10-60 mg by mouth See admin instructions. Take these number of tablets on consecutive days:6-5-4-3-2-1   SYLVANT IV Inject 1 Dose into the vein every 21 ( twenty-one) days.            Durable Medical Equipment  (From admission, onward)         Start     Ordered   09/23/19 1035  DME Walker rolling  Once    Question Answer Comment  Walker: With 5 Inch Wheels   Patient needs a walker to treat with the following condition Knee joint replacement status, left      09/23/19 1035   09/23/19 1035  DME 3 n 1  Once     09/23/19 1035   09/23/19 1035  DME Bedside commode  Once    Question:  Patient needs a bedside commode to treat with the following condition  Answer:  Knee joint replacement status, left   09/23/19 1035          Diagnostic Studies: DG Knee 1-2 Views Left  Result Date: 09/23/2019 CLINICAL DATA:  Status post left knee replacement EXAM: LEFT KNEE - 2 VIEW COMPARISON:  None. FINDINGS: Left knee prosthesis is noted in satisfactory position. No acute bony or soft tissue abnormality is noted. IMPRESSION: Postsurgical change without acute abnormality Electronically Signed   By: Inez Catalina M.D.   On: 09/23/2019 09:41    Disposition:     Follow-up Information    Duanne Guess, PA-C Follow up in 2 week(s).   Specialties: Orthopedic Surgery, Emergency Medicine Contact information: [place] [address] [phone]            Signed: Feliberto Gottron 09/25/2019, 8:27 AM

## 2019-09-25 NOTE — Discharge Planning (Signed)
Patient IV removed.  RN assessment and VS revealed stability for DC to home with [facility].  At home equipment provided.  Patient had small BM and worked with PT in hall and stairs prior to DC.  Though she struggled a bit with PT (compared to 3/24) - Dr. Hanley Seamen verbal ok to discharge.  Dressing changed, but still attempting to bleed through - Dr. Darlyn Chamber and extra supplies provided at discharge (to reinforce or change as needed).  Discharge papers given, explained and educated.  Told of needed FU appt and appt made.  Scripts sent to pharmacy, per patient request.  PO Pain meds given just prior to discharge.  Once ready, will be wheeled to front and family transporting home via car.

## 2019-09-25 NOTE — Plan of Care (Signed)

## 2019-09-25 NOTE — TOC Transition Note (Signed)
Transition of Care [facility]) - CM/SW Discharge Note   Patient Details  Name: Brooke Molina MRN: [id number] Date of Birth: May 11, 1976  [facility] CM/SW Contact:  Elease Hashimoto, LCSW Phone Number: 09/25/2019, 8:38 AM   Clinical Narrative:   Pt is ready to go home, husband will come and transport home. Between husband and son someone will be there with her. Rolling walker and 3 in 1 in room. [facility] set up for follow up. No further follow due to discharge today.    Final next level of care: Home w Home Health Services Barriers to Discharge: Barriers Resolved   Patient Goals and CMS Choice Patient states their goals for this hospitalization and ongoing recovery are:: I'm going home tomorrow      Discharge Placement                Patient to be transferred to facility by: richard Name of family member notified: husband Patient and family notified of of transfer: 09/25/19  Discharge Plan and Services In-house Referral: Clinical Social Work   Post Acute Care Choice: Museum/gallery conservator, Home Health          DME Arranged: 3-N-1, Walker rolling DME Agency: [employer] Date DME Agency Contacted: 09/24/19 Time DME Agency Contacted: 27 Representative spoke with at DME Agency: [place]: PT [place]: [facility]) Date [place]: 09/24/19 Time [place]: 33 Representative spoke with at [place]: teresa  Social Determinants of Health ([place]) Interventions     Readmission Risk Interventions No flowsheet data found.

## 2019-09-25 NOTE — Discharge Instructions (Signed)

## 2020-04-07 ENCOUNTER — Other Ambulatory Visit: Payer: Self-pay | Admitting: Family Medicine

## 2020-04-07 DIAGNOSIS — Z1231 Encounter for screening mammogram for malignant neoplasm of breast: Secondary | ICD-10-CM

## 2020-05-05 ENCOUNTER — Ambulatory Visit
Admission: RE | Admit: 2020-05-05 | Discharge: 2020-05-05 | Disposition: A | Discharge status: Home / Self Care | Payer: BC Managed Care – PPO | Source: Ambulatory Visit | Attending: Family Medicine | Admitting: Family Medicine

## 2020-05-05 ENCOUNTER — Other Ambulatory Visit: Payer: Self-pay

## 2020-05-05 DIAGNOSIS — Z1231 Encounter for screening mammogram for malignant neoplasm of breast: Secondary | ICD-10-CM | POA: Diagnosis not present

## 2020-07-12 ENCOUNTER — Other Ambulatory Visit: Payer: Self-pay | Admitting: Family Medicine

## 2020-07-12 DIAGNOSIS — H538 Other visual disturbances: Secondary | ICD-10-CM

## 2020-07-15 ENCOUNTER — Other Ambulatory Visit: Payer: Self-pay

## 2020-07-15 ENCOUNTER — Ambulatory Visit
Admission: RE | Admit: 2020-07-15 | Discharge: 2020-07-15 | Disposition: A | Discharge status: Home / Self Care | Payer: BC Managed Care – PPO | Source: Ambulatory Visit | Attending: Family Medicine | Admitting: Family Medicine

## 2020-07-15 DIAGNOSIS — H538 Other visual disturbances: Secondary | ICD-10-CM | POA: Diagnosis not present

## 2020-08-11 ENCOUNTER — Other Ambulatory Visit: Payer: Self-pay | Admitting: Orthopedic Surgery

## 2020-08-11 DIAGNOSIS — S52125A Nondisplaced fracture of head of left radius, initial encounter for closed fracture: Secondary | ICD-10-CM

## 2020-08-11 DIAGNOSIS — S52045A Nondisplaced fracture of coronoid process of left ulna, initial encounter for closed fracture: Secondary | ICD-10-CM

## 2020-08-17 ENCOUNTER — Other Ambulatory Visit: Payer: Self-pay

## 2020-08-17 ENCOUNTER — Ambulatory Visit
Admission: RE | Admit: 2020-08-17 | Discharge: 2020-08-17 | Disposition: A | Discharge status: Home / Self Care | Payer: BC Managed Care – PPO | Source: Ambulatory Visit | Attending: Orthopedic Surgery | Admitting: Orthopedic Surgery

## 2020-08-17 DIAGNOSIS — S52045A Nondisplaced fracture of coronoid process of left ulna, initial encounter for closed fracture: Secondary | ICD-10-CM | POA: Diagnosis not present

## 2020-08-17 DIAGNOSIS — S52125A Nondisplaced fracture of head of left radius, initial encounter for closed fracture: Secondary | ICD-10-CM | POA: Diagnosis present

## 2020-09-24 ENCOUNTER — Ambulatory Visit
Admission: RE | Admit: 2020-09-24 | Discharge: 2020-09-24 | Disposition: A | Discharge status: Home / Self Care | Payer: BC Managed Care – PPO | Source: Ambulatory Visit | Attending: Family Medicine | Admitting: Family Medicine

## 2020-09-24 ENCOUNTER — Other Ambulatory Visit (HOSPITAL_COMMUNITY): Payer: Self-pay | Admitting: Family Medicine

## 2020-09-24 ENCOUNTER — Other Ambulatory Visit: Payer: Self-pay | Admitting: Family Medicine

## 2020-09-24 ENCOUNTER — Other Ambulatory Visit: Payer: Self-pay

## 2020-09-24 DIAGNOSIS — R110 Nausea: Secondary | ICD-10-CM

## 2020-09-24 DIAGNOSIS — R519 Headache, unspecified: Secondary | ICD-10-CM

## 2020-09-24 DIAGNOSIS — H53149 Visual discomfort, unspecified: Secondary | ICD-10-CM

## 2020-12-09 ENCOUNTER — Other Ambulatory Visit: Payer: Self-pay | Admitting: Family Medicine

## 2020-12-09 DIAGNOSIS — R110 Nausea: Secondary | ICD-10-CM

## 2020-12-09 DIAGNOSIS — R1011 Right upper quadrant pain: Secondary | ICD-10-CM

## 2020-12-09 DIAGNOSIS — K091 Developmental (nonodontogenic) cysts of oral region: Secondary | ICD-10-CM

## 2020-12-10 ENCOUNTER — Other Ambulatory Visit: Payer: Self-pay

## 2020-12-10 ENCOUNTER — Ambulatory Visit
Admission: RE | Admit: 2020-12-10 | Discharge: 2020-12-10 | Disposition: A | Discharge status: Home / Self Care | Payer: BC Managed Care – PPO | Source: Ambulatory Visit | Attending: Family Medicine | Admitting: Family Medicine

## 2020-12-10 DIAGNOSIS — R1011 Right upper quadrant pain: Secondary | ICD-10-CM | POA: Insufficient documentation

## 2020-12-10 DIAGNOSIS — R110 Nausea: Secondary | ICD-10-CM | POA: Diagnosis present

## 2020-12-13 ENCOUNTER — Other Ambulatory Visit: Payer: Self-pay | Admitting: Family Medicine

## 2020-12-13 DIAGNOSIS — R1011 Right upper quadrant pain: Secondary | ICD-10-CM

## 2020-12-21 ENCOUNTER — Ambulatory Visit
Admission: RE | Admit: 2020-12-21 | Discharge: 2020-12-21 | Disposition: A | Discharge status: Home / Self Care | Payer: BC Managed Care – PPO | Source: Ambulatory Visit | Attending: Family Medicine | Admitting: Family Medicine

## 2020-12-21 ENCOUNTER — Other Ambulatory Visit: Payer: Self-pay

## 2020-12-21 DIAGNOSIS — R1011 Right upper quadrant pain: Secondary | ICD-10-CM | POA: Diagnosis not present

## 2020-12-21 MED ORDER — TECHNETIUM TC 99M MEBROFENIN IV KIT
5.0000 | PACK | Freq: Once | INTRAVENOUS | Status: AC | PRN
  Administered 2020-12-21: 5.2 via INTRAVENOUS

## 2021-03-01 ENCOUNTER — Ambulatory Visit: Payer: BC Managed Care – PPO | Admitting: Dermatology

## 2021-03-19 ENCOUNTER — Emergency Department
Admission: EM | Admit: 2021-03-19 | Discharge: 2021-03-19 | Disposition: A | Discharge status: Home / Self Care | Payer: BC Managed Care – PPO | Attending: Emergency Medicine | Admitting: Emergency Medicine

## 2021-03-19 ENCOUNTER — Emergency Department: Payer: BC Managed Care – PPO

## 2021-03-19 ENCOUNTER — Other Ambulatory Visit: Payer: Self-pay

## 2021-03-19 DIAGNOSIS — Z96652 Presence of left artificial knee joint: Secondary | ICD-10-CM | POA: Insufficient documentation

## 2021-03-19 DIAGNOSIS — H538 Other visual disturbances: Secondary | ICD-10-CM | POA: Insufficient documentation

## 2021-03-19 DIAGNOSIS — Z7901 Long term (current) use of anticoagulants: Secondary | ICD-10-CM | POA: Diagnosis not present

## 2021-03-19 DIAGNOSIS — H539 Unspecified visual disturbance: Secondary | ICD-10-CM

## 2021-03-19 DIAGNOSIS — F17210 Nicotine dependence, cigarettes, uncomplicated: Secondary | ICD-10-CM | POA: Insufficient documentation

## 2021-03-19 LAB — CBC WITH DIFFERENTIAL/PLATELET
Abs Immature Granulocytes: 0.02 10*3/uL (ref 0.00–0.07)
Basophils Absolute: 0.1 10*3/uL (ref 0.0–0.1)
Basophils Relative: 1 %
Eosinophils Absolute: 0.2 10*3/uL (ref 0.0–0.5)
Eosinophils Relative: 3 %
HCT: 40.2 % (ref 36.0–46.0)
Hemoglobin: 13.9 g/dL (ref 12.0–15.0)
Immature Granulocytes: 0 %
Lymphocytes Relative: 30 %
Lymphs Abs: 2.4 10*3/uL (ref 0.7–4.0)
MCH: 33.3 pg (ref 26.0–34.0)
MCHC: 34.6 g/dL (ref 30.0–36.0)
MCV: 96.4 fL (ref 80.0–100.0)
Monocytes Absolute: 0.5 10*3/uL (ref 0.1–1.0)
Monocytes Relative: 6 %
Neutro Abs: 4.9 10*3/uL (ref 1.7–7.7)
Neutrophils Relative %: 60 %
Platelets: 206 10*3/uL (ref 150–400)
RBC: 4.17 MIL/uL (ref 3.87–5.11)
RDW: 13.2 % (ref 11.5–15.5)
WBC: 8.1 10*3/uL (ref 4.0–10.5)
nRBC: 0 % (ref 0.0–0.2)

## 2021-03-19 LAB — BASIC METABOLIC PANEL
Anion gap: 9 (ref 5–15)
BUN: 8 mg/dL (ref 6–20)
CO2: 22 mmol/L (ref 22–32)
Calcium: 8.9 mg/dL (ref 8.9–10.3)
Chloride: 106 mmol/L (ref 98–111)
Creatinine, Ser: 0.71 mg/dL (ref 0.44–1.00)
GFR, Estimated: >60 mL/min (ref >60)
Glucose, Bld: 82 mg/dL (ref 70–99)
Potassium: 4.3 mmol/L (ref 3.5–5.1)
Sodium: 137 mmol/L (ref 135–145)

## 2021-03-19 NOTE — ED Notes (Signed)
Attempted IV x 1. Will restick if needed for imaging

## 2021-03-19 NOTE — ED Triage Notes (Signed)
Pt comes pov with left eye blurriness. Pt states it started at 8am. No pain, no other neuro deficits-equal strength, equal push/pull, pupils equal and reactive. Per Dr. Cherylann Banas, no need for blood or CT at this time. Pt states "very mild" headache.

## 2021-03-19 NOTE — ED Provider Notes (Signed)
[facility] Emergency Department Provider Note  ____________________________________________   Event Date/Time   First MD Initiated Contact with Patient 03/19/21 1028     (approximate)  I have reviewed the triage vital signs and the nursing notes.   HISTORY  Chief Complaint Eye Problem    HPI Brooke Molina is a 45 y.o. female presents emergency department with sudden onset of blurred vision.  History of the same.  Her doctor is concerned she may have had a TIA.  States she had a headache when it happened.  States it lasted about 1 hour and then dissipated.  Denies any fever or chills.  No eye injury.  No drainage.  No change in her glasses recently.  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 Authorizing Provider  acetaminophen (TYLENOL) 500 MG tablet Take 1,000 mg by mouth every 6 (six) hours as needed for mild pain or moderate pain.    [provider]  docusate sodium (COLACE) 100 MG capsule Take 1 capsule (100 mg total) by mouth 2 (two) times daily. 09/25/19   Duanne Guess, PA-C  enoxaparin (LOVENOX) 40 MG/0.4ML injection Inject 0.4 mLs (40 mg total) into the skin daily for 14 days. 09/25/19 10/09/19  Duanne Guess, PA-C  methocarbamol (ROBAXIN) 500 MG tablet  Take 1 tablet (500 mg total) by mouth every 6 (six) hours as needed for muscle spasms. 09/25/19   Duanne Guess, PA-C  oxyCODONE (OXY IR/ROXICODONE) 5 MG immediate release tablet Take 1-2 tablets (5-10 mg total) by mouth every 4 (four) hours as needed for moderate pain (pain score 4-6). 09/25/19   Duanne Guess, PA-C  predniSONE (DELTASONE) 10 MG tablet Take 10-60 mg by mouth See admin instructions. Take these number of tablets on consecutive [id number] 09/04/19   [provider]  Siltuximab (SYLVANT IV) Inject 1 Dose into the vein every 21 ( twenty-one) days.    [provider]    Allergies Patient has no known allergies.  Family History  Problem Relation Age of Onset   Breast cancer Maternal Aunt     Social History Social History   Tobacco Use   Smoking status: Every Day    Packs/day: 1.00    Years: 24.00    Pack years: 24.00    Types: Cigarettes   Smokeless tobacco: Never  Vaping Use   Vaping Use: Never used  Substance Use Topics   Alcohol use: Yes    Comment: rarely   Drug use: No    Review of Systems  Constitutional: No fever/chills Eyes: Positive visual changes. ENT: No sore throat. Respiratory: Denies cough Cardiovascular: Denies chest pain Gastrointestinal: Denies abdominal pain Genitourinary: Negative for dysuria. Musculoskeletal:  Negative for back pain. Skin: Negative for rash. Psychiatric: no mood changes,     ____________________________________________   PHYSICAL EXAM:  VITAL SIGNS: ED Triage Vitals  Enc Vitals Group     BP 03/19/21 0959 (!) 123/96     Pulse Rate 03/19/21 0959 75     Resp 03/19/21 0959 18     Temp 03/19/21 0959 98.3 F (36.8 C)     Temp Source 03/19/21 0959 Oral     SpO2 03/19/21 0959 97 %     Weight 03/19/21 1000 246 lb (111.6 kg)     Height 03/19/21 1000 '5\' 10"'$  (1.778 m)     Head Circumference --      Peak Flow --      Pain Score 03/19/21 0959 6     Pain Loc --      Pain Edu? --      Excl.  in [place]? --     Constitutional: Alert and oriented. Well appearing and in no acute distress. Eyes: Conjunctivae are normal.  PERRL, EOMI Head: Atraumatic. Nose: No congestion/rhinnorhea. Mouth/Throat: Mucous membranes are moist.   Neck:  supple no lymphadenopathy noted Cardiovascular: Normal rate, regular rhythm. Heart sounds are normal Respiratory: Normal respiratory effort.  No retractions, lungs c t a  GU: deferred Musculoskeletal: FROM all extremities, warm and well perfused Neurologic:  Normal speech and language.  Cranial nerves II through XII grossly intact Skin:  Skin is warm, dry and intact. No rash noted. Psychiatric: Mood and affect are normal. Speech and behavior are normal.  ____________________________________________   LABS (all labs ordered are listed, but only abnormal results are displayed)  Labs Reviewed  BASIC METABOLIC PANEL  CBC WITH DIFFERENTIAL/PLATELET   ____________________________________________   ____________________________________________  RADIOLOGY  CT of the head MRI of the head  ____________________________________________   PROCEDURES  Procedure(s) performed: No  Procedures    ____________________________________________   INITIAL IMPRESSION / ASSESSMENT AND PLAN / ED COURSE  Pertinent labs & imaging results that were available during my care of the patient were reviewed by me and considered in my medical decision making (see chart for details).   The patient is a 45 year old female presents with visual changes.  See HPI.  Physical exam shows patient per stable  DDx: Migraine aura, TIA, CVA  CT of the head is negative  Labs, MRI ordered  Labs are reassuring, CBC and metabolic panel are normal  MRI reviewed by me confirmed by radiology to be negative  I did explain all the findings to the patient.  Feel that her visual disturbances not a TIA or has not caused any infarct.  She is to follow-up with her regular doctor.   Take an aspirin a day.  Return if worsening.     Brooke Molina was evaluated in Emergency Department on 03/19/2021 for the symptoms described in the history of present illness. She was evaluated in the context of the global COVID-19 pandemic, which necessitated consideration that the patient might be at risk for infection with the SARS-CoV-2 virus that causes COVID-19. Institutional protocols and algorithms that pertain to the evaluation of patients at risk for COVID-19 are in a state of rapid change based on information released by regulatory bodies including the CDC and federal and state organizations. These policies and algorithms were followed during the patient's care in the ED.    As part of my medical decision making, I reviewed the following data within the [place] notes reviewed and incorporated,  Labs reviewed , Old chart reviewed, Radiograph reviewed , Notes from prior ED visits, and [place] Controlled Substance Database  ____________________________________________   FINAL CLINICAL IMPRESSION(S) / ED DIAGNOSES  Final diagnoses:  Visual changes      NEW MEDICATIONS STARTED DURING THIS VISIT:  New Prescriptions   No medications on file     Note:  This document was prepared using Dragon voice recognition software and may include unintentional dictation errors.    Versie Starks, PA-C 03/19/21 1330    Harvest Dark, MD 03/19/21 1459

## 2021-03-19 NOTE — Discharge Instructions (Signed)
Follow-up with your regular doctor as needed Your CT and MRI did not show TIA. Your change in vision may just be due to a headache. Return the emergency department if worsening. Try taking a aspirin a day, this may decrease any visual changes associated with a headache.

## 2021-03-19 NOTE — ED Notes (Signed)
Pt c/o eye blurriness. Redness in sclera noted

## 2021-05-30 ENCOUNTER — Other Ambulatory Visit: Payer: Self-pay | Admitting: Family Medicine

## 2021-05-30 ENCOUNTER — Ambulatory Visit
Admission: RE | Admit: 2021-05-30 | Discharge: 2021-05-30 | Disposition: A | Discharge status: Home / Self Care | Payer: BC Managed Care – PPO | Source: Ambulatory Visit | Attending: Family Medicine | Admitting: Family Medicine

## 2021-05-30 ENCOUNTER — Other Ambulatory Visit: Payer: Self-pay

## 2021-05-30 DIAGNOSIS — R0989 Other specified symptoms and signs involving the circulatory and respiratory systems: Secondary | ICD-10-CM

## 2021-05-30 MED ORDER — IOHEXOL 350 MG/ML SOLN
75.0000 mL | Freq: Once | INTRAVENOUS | Status: AC | PRN
  Administered 2021-05-30: 14:00:00 75 mL via INTRAVENOUS

## 2021-07-19 ENCOUNTER — Other Ambulatory Visit: Payer: Self-pay | Admitting: Orthopedic Surgery

## 2021-07-27 NOTE — Patient Instructions (Addendum)
Your procedure is scheduled on: 08/09/21 - Tuesday Report to the Registration Desk on the 1st floor of the [facility]. To find out your arrival time, please call [phone] between 1PM - 3PM on: 08/03/21 - Monday Report to [facility] for Covid test on 08/05/21 between 8 am and 12 :00 noon.  REMEMBER: Instructions that are not followed completely may result in serious medical risk, up to and including death; or upon the discretion of your surgeon and anesthesiologist your surgery may need to be rescheduled.  Do not eat food after midnight the night before surgery.  No gum chewing, lozengers or hard candies.  You may however, drink CLEAR liquids up to 2 hours before you are scheduled to arrive for your surgery. Do not drink anything within 2 hours of your scheduled arrival time.  Clear liquids include: - water  - apple juice without pulp - gatorade (not RED, PURPLE, OR BLUE) - black coffee or tea (Do NOT add milk or creamers to the coffee or tea) Do NOT drink anything that is not on this list.  In addition, your doctor has ordered for you to drink the provided  Ensure Pre-Surgery Clear Carbohydrate Drink  Drinking this carbohydrate drink up to two hours before surgery helps to reduce insulin resistance and improve patient outcomes. Please complete drinking 2 hours prior to scheduled arrival time.  TAKE THESE MEDICATIONS THE MORNING OF SURGERY WITH A SIP OF WATER:  - DULoxetine (CYMBALTA) 60 MG capsule - gabapentin (NEURONTIN) 300 MG capsule - hydroxychloroquine (PLAQUENIL) 200 MG tablet  One week prior to surgery: Stop Anti-inflammatories (NSAIDS) such as Advil, Aleve, Ibuprofen, Motrin, Naproxen, Naprosyn and Aspirin based products such as Excedrin, Goodys Powder, BC Powder.  Stop ANY OVER THE COUNTER supplements until after surgery.  You may however, continue to take Tylenol if needed for pain up until the day of surgery.  No Alcohol for 24 hours before or after  surgery.  No Smoking including e-cigarettes for 24 hours prior to surgery.  No chewable tobacco products for at least 6 hours prior to surgery.  No nicotine patches on the day of surgery.  Do not use any "recreational" drugs for at least a week prior to your surgery.  Please be advised that the combination of cocaine and anesthesia may have negative outcomes, up to and including death. If you test positive for cocaine, your surgery will be cancelled.  On the morning of surgery brush your teeth with toothpaste and water, you may rinse your mouth with mouthwash if you wish. Do not swallow any toothpaste or mouthwash.  Use CHG Soap or wipes as directed on instruction sheet.  Do not wear jewelry, make-up, hairpins, clips or nail polish.  Do not wear lotions, powders, or perfumes.   Do not shave body from the neck down 48 hours prior to surgery just in case you cut yourself which could leave a site for infection.  Also, freshly shaved skin may become irritated if using the CHG soap.  Contact lenses, hearing aids and dentures may not be worn into surgery.  Do not bring valuables to the hospital. [facility] is not responsible for any missing/lost belongings or valuables.   Notify your doctor if there is any change in your medical condition (cold, fever, infection).  Wear comfortable clothing (specific to your surgery type) to the hospital.  After surgery, you can help prevent lung complications by doing breathing exercises.  Take deep breaths and cough every 1-2 hours. Your  doctor may order a device called an Incentive Spirometer to help you take deep breaths. When coughing or sneezing, hold a pillow firmly against your incision with both hands. This is called splinting. Doing this helps protect your incision. It also decreases belly discomfort.  If you are being admitted to the hospital overnight, leave your suitcase in the car. After surgery it may be brought to your room.  If you  are being discharged the day of surgery, you will not be allowed to drive home. You will need a responsible adult (18 years or older) to drive you home and stay with you that night.   If you are taking public transportation, you will need to have a responsible adult (18 years or older) with you. Please confirm with your physician that it is acceptable to use public transportation.   Please call the [place] [facility]. at [phone] if you have any questions about these instructions.  Surgery Visitation Policy:  Patients undergoing a surgery or procedure may have one family member or support person with them as long as that person is not COVID-19 positive or experiencing its symptoms.  That person may remain in the waiting area during the procedure and may rotate out with other people.  Inpatient Visitation:    Visiting hours are 7 a.m. to 8 p.m. Up to two visitors ages 16+ are allowed at one time in a patient room. The visitors may rotate out with other people during the day. Visitors must check out when they leave, or other visitors will not be allowed. One designated support person may remain overnight. The visitor must pass COVID-19 screenings, use hand sanitizer when entering and exiting the patients room and wear a mask at all times, including in the patients room. Patients must also wear a mask when staff or their visitor are in the room. Masking is required regardless of vaccination status.

## 2021-07-28 ENCOUNTER — Other Ambulatory Visit: Payer: Self-pay

## 2021-07-28 ENCOUNTER — Encounter
Admission: RE | Admit: 2021-07-28 | Discharge: 2021-07-28 | Disposition: A | Discharge status: Home / Self Care | Payer: BC Managed Care – PPO | Source: Ambulatory Visit | Attending: Orthopedic Surgery | Admitting: Orthopedic Surgery

## 2021-07-28 VITALS — BP 110/70 | HR 65 | Resp 18 | Wt 242.1 lb

## 2021-07-28 DIAGNOSIS — Z01818 Encounter for other preprocedural examination: Secondary | ICD-10-CM | POA: Diagnosis present

## 2021-07-28 DIAGNOSIS — Z01812 Encounter for preprocedural laboratory examination: Secondary | ICD-10-CM

## 2021-07-28 HISTORY — DX: Anxiety disorder, unspecified: F41.9

## 2021-07-28 HISTORY — DX: Myoneural disorder, unspecified: G70.9

## 2021-07-28 LAB — CBC WITH DIFFERENTIAL/PLATELET
Abs Immature Granulocytes: 0.02 10*3/uL (ref 0.00–0.07)
Basophils Absolute: 0.1 10*3/uL (ref 0.0–0.1)
Basophils Relative: 1 %
Eosinophils Absolute: 0.2 10*3/uL (ref 0.0–0.5)
Eosinophils Relative: 3 %
HCT: 41.4 % (ref 36.0–46.0)
Hemoglobin: 14 g/dL (ref 12.0–15.0)
Immature Granulocytes: 0 %
Lymphocytes Relative: 35 %
Lymphs Abs: 2.2 10*3/uL (ref 0.7–4.0)
MCH: 31.6 pg (ref 26.0–34.0)
MCHC: 33.8 g/dL (ref 30.0–36.0)
MCV: 93.5 fL (ref 80.0–100.0)
Monocytes Absolute: 0.5 10*3/uL (ref 0.1–1.0)
Monocytes Relative: 7 %
Neutro Abs: 3.5 10*3/uL (ref 1.7–7.7)
Neutrophils Relative %: 54 %
Platelets: 271 10*3/uL (ref 150–400)
RBC: 4.43 MIL/uL (ref 3.87–5.11)
RDW: 13.1 % (ref 11.5–15.5)
WBC: 6.5 10*3/uL (ref 4.0–10.5)
nRBC: 0 % (ref 0.0–0.2)

## 2021-07-28 LAB — COMPREHENSIVE METABOLIC PANEL
ALT: 22 U/L (ref 0–44)
AST: 20 U/L (ref 15–41)
Albumin: 4 g/dL (ref 3.5–5.0)
Alkaline Phosphatase: 50 U/L (ref 38–126)
Anion gap: 5 (ref 5–15)
BUN: 9 mg/dL (ref 6–20)
CO2: 26 mmol/L (ref 22–32)
Calcium: 8.8 mg/dL — ABNORMAL LOW (ref 8.9–10.3)
Chloride: 105 mmol/L (ref 98–111)
Creatinine, Ser: 0.62 mg/dL (ref 0.44–1.00)
GFR, Estimated: >60 mL/min (ref >60)
Glucose, Bld: 71 mg/dL (ref 70–99)
Potassium: 3.6 mmol/L (ref 3.5–5.1)
Sodium: 136 mmol/L (ref 135–145)
Total Bilirubin: 0.7 mg/dL (ref 0.3–1.2)
Total Protein: 6.5 g/dL (ref 6.5–8.1)

## 2021-07-28 LAB — URINALYSIS, ROUTINE W REFLEX MICROSCOPIC
Glucose, UA: NEGATIVE mg/dL
Hgb urine dipstick: NEGATIVE
Ketones, ur: NEGATIVE mg/dL
Leukocytes,Ua: NEGATIVE
Nitrite: NEGATIVE
Protein, ur: NEGATIVE mg/dL
Specific Gravity, Urine: <1.005 — ABNORMAL LOW (ref 1.005–1.030)
pH: 6.5 (ref 5.0–8.0)

## 2021-07-28 LAB — SURGICAL PCR SCREEN
MRSA, PCR: NEGATIVE
Staphylococcus aureus: POSITIVE — AB

## 2021-07-28 LAB — TYPE AND SCREEN
ABO/RH(D): O POS
Antibody Screen: NEGATIVE

## 2021-08-05 ENCOUNTER — Other Ambulatory Visit
Admission: RE | Admit: 2021-08-05 | Discharge: 2021-08-05 | Disposition: A | Discharge status: Home / Self Care | Payer: BC Managed Care – PPO | Source: Ambulatory Visit | Attending: Orthopedic Surgery | Admitting: Orthopedic Surgery

## 2021-08-05 ENCOUNTER — Other Ambulatory Visit: Payer: Self-pay

## 2021-08-05 DIAGNOSIS — Z01812 Encounter for preprocedural laboratory examination: Secondary | ICD-10-CM | POA: Insufficient documentation

## 2021-08-05 DIAGNOSIS — Z20822 Contact with and (suspected) exposure to covid-19: Secondary | ICD-10-CM | POA: Diagnosis not present

## 2021-08-06 LAB — SARS CORONAVIRUS 2 (TAT 6-24 HRS): SARS Coronavirus 2: NEGATIVE

## 2021-08-07 NOTE — Anesthesia Preprocedure Evaluation (Addendum)
Anesthesia Evaluation  Patient identified by MRN, date of birth, ID band Patient awake    Reviewed: Allergy & Precautions, NPO status , Patient's Chart, lab work & pertinent test results  History of Anesthesia Complications (+) PONV and history of anesthetic complications (Vomiting while recovering on the floor)  Airway Mallampati: III  TM Distance: >3 FB Neck ROM: full    Dental  (+) Poor Dentition, Missing   Pulmonary Current Smoker and Patient abstained from smoking.,    Pulmonary exam normal        Cardiovascular negative cardio ROS Normal cardiovascular exam     Neuro/Psych PSYCHIATRIC DISORDERS Anxiety  Neuromuscular disease (neuropathy)    GI/Hepatic negative GI ROS, Neg liver ROS,   Endo/Other  negative endocrine ROS  Renal/GU      Musculoskeletal  (+) Arthritis , Osteoarthritis,    Abdominal (+) + obese,   Peds  Hematology negative hematology ROS (+)   Anesthesia Other Findings Past Medical History: No date: Anxiety No date: 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[place]) No date: Neuromuscular disorder ([place]) No date: Osteoarthritis     Comment:  bilateral knees  Past Surgical History: No date: LYMPH GLAND EXCISION; Left     Comment:  neck No date: LYMPH NODE BIOPSY     Comment:  chest 1980: TONSILLECTOMY 09/23/2019: TOTAL KNEE ARTHROPLASTY; Left     Comment:  Procedure: LEFT TOTAL KNEE ARTHROPLASTY;  Surgeon: Hessie Knows, MD;  Location: [facility] ORS;  Service: Orthopedics;               Laterality: Left; No date: TUBAL LIGATION No date: TYMPANOSTOMY TUBE PLACEMENT     Reproductive/Obstetrics negative OB ROS                            Anesthesia Physical Anesthesia Plan  ASA: 2  Anesthesia Plan: Spinal   Post-op Pain Management: Regional block and Toradol IV (intra-op)   Induction:   PONV Risk Score and Plan: 2 and Ondansetron, Aprepitant and Propofol infusion  Airway  Management Planned: Nasal Cannula and Natural Airway  Additional Equipment:   Intra-op Plan:   Post-operative Plan:   Informed Consent: I have reviewed the patients History and Physical, chart, labs and discussed the procedure including the risks, benefits and alternatives for the proposed anesthesia with the patient or authorized representative who has indicated his/her understanding and acceptance.     Dental advisory given  Plan Discussed with: Anesthesiologist, CRNA and Surgeon  Anesthesia Plan Comments:        Anesthesia Quick Evaluation

## 2021-08-09 ENCOUNTER — Observation Stay
Admission: RE | Admit: 2021-08-09 | Discharge: 2021-08-10 | Disposition: A | Discharge status: Home / Self Care | Payer: BC Managed Care – PPO | Attending: Orthopedic Surgery | Admitting: Orthopedic Surgery

## 2021-08-09 ENCOUNTER — Ambulatory Visit: Payer: BC Managed Care – PPO | Admitting: Anesthesiology

## 2021-08-09 ENCOUNTER — Encounter
Admission: RE | Disposition: A | Discharge status: Home / Self Care | Payer: Self-pay | Source: Home / Self Care | Attending: Orthopedic Surgery

## 2021-08-09 ENCOUNTER — Encounter: Payer: Self-pay | Admitting: Orthopedic Surgery

## 2021-08-09 ENCOUNTER — Ambulatory Visit: Payer: BC Managed Care – PPO | Admitting: Urgent Care

## 2021-08-09 ENCOUNTER — Other Ambulatory Visit: Payer: Self-pay

## 2021-08-09 ENCOUNTER — Observation Stay: Payer: BC Managed Care – PPO

## 2021-08-09 DIAGNOSIS — Z96652 Presence of left artificial knee joint: Secondary | ICD-10-CM | POA: Insufficient documentation

## 2021-08-09 DIAGNOSIS — G8918 Other acute postprocedural pain: Secondary | ICD-10-CM

## 2021-08-09 DIAGNOSIS — G629 Polyneuropathy, unspecified: Secondary | ICD-10-CM | POA: Diagnosis not present

## 2021-08-09 DIAGNOSIS — E538 Deficiency of other specified B group vitamins: Secondary | ICD-10-CM | POA: Insufficient documentation

## 2021-08-09 DIAGNOSIS — M1711 Unilateral primary osteoarthritis, right knee: Secondary | ICD-10-CM | POA: Diagnosis present

## 2021-08-09 DIAGNOSIS — Z96651 Presence of right artificial knee joint: Secondary | ICD-10-CM

## 2021-08-09 HISTORY — PX: TOTAL KNEE ARTHROPLASTY: SHX125

## 2021-08-09 LAB — CBC
HCT: 40.5 % (ref 36.0–46.0)
Hemoglobin: 13.3 g/dL (ref 12.0–15.0)
MCH: 31.4 pg (ref 26.0–34.0)
MCHC: 32.8 g/dL (ref 30.0–36.0)
MCV: 95.7 fL (ref 80.0–100.0)
Platelets: 265 10*3/uL (ref 150–400)
RBC: 4.23 MIL/uL (ref 3.87–5.11)
RDW: 13 % (ref 11.5–15.5)
WBC: 11.9 10*3/uL — ABNORMAL HIGH (ref 4.0–10.5)
nRBC: 0 % (ref 0.0–0.2)

## 2021-08-09 LAB — POCT PREGNANCY, URINE: Preg Test, Ur: NEGATIVE

## 2021-08-09 LAB — CREATININE, SERUM
Creatinine, Ser: 0.67 mg/dL (ref 0.44–1.00)
GFR, Estimated: >60 mL/min (ref >60)

## 2021-08-09 SURGERY — ARTHROPLASTY, KNEE, TOTAL
Anesthesia: Spinal | Site: Knee | Laterality: Right

## 2021-08-09 MED ORDER — CEFAZOLIN SODIUM-DEXTROSE 2-4 GM/100ML-% IV SOLN
INTRAVENOUS | Status: AC
  Administered 2021-08-09: 2 g via INTRAVENOUS
  Filled 2021-08-09: qty 100

## 2021-08-09 MED ORDER — DEXAMETHASONE SODIUM PHOSPHATE 10 MG/ML IJ SOLN
INTRAMUSCULAR | Status: AC
  Filled 2021-08-09: qty 1

## 2021-08-09 MED ORDER — MORPHINE SULFATE (PF) 10 MG/ML IV SOLN
INTRAVENOUS | Status: AC
  Filled 2021-08-09: qty 1

## 2021-08-09 MED ORDER — HYDROXYCHLOROQUINE SULFATE 200 MG PO TABS
200.0000 mg | ORAL_TABLET | Freq: Two times a day (BID) | ORAL | Status: DC
  Administered 2021-08-09 – 2021-08-10 (×2): 200 mg via ORAL
  Filled 2021-08-09 (×3): qty 1

## 2021-08-09 MED ORDER — MIDAZOLAM HCL 2 MG/2ML IJ SOLN
INTRAMUSCULAR | Status: AC
  Filled 2021-08-09: qty 2

## 2021-08-09 MED ORDER — LIDOCAINE HCL (PF) 2 % IJ SOLN
INTRAMUSCULAR | Status: AC
  Filled 2021-08-09: qty 5

## 2021-08-09 MED ORDER — DEXAMETHASONE SODIUM PHOSPHATE 10 MG/ML IJ SOLN
INTRAMUSCULAR | Status: DC | PRN
  Administered 2021-08-09: 5 mg via INTRAVENOUS

## 2021-08-09 MED ORDER — TRAMADOL HCL 50 MG PO TABS
50.0000 mg | ORAL_TABLET | Freq: Four times a day (QID) | ORAL | Status: DC
  Administered 2021-08-09 – 2021-08-10 (×4): 50 mg via ORAL

## 2021-08-09 MED ORDER — TRAZODONE HCL 50 MG PO TABS
50.0000 mg | ORAL_TABLET | Freq: Every evening | ORAL | Status: DC | PRN
  Filled 2021-08-09: qty 2

## 2021-08-09 MED ORDER — ZOLPIDEM TARTRATE 5 MG PO TABS: 5.0000 mg | ORAL_TABLET | Freq: Every evening | ORAL | Status: DC | PRN

## 2021-08-09 MED ORDER — CEFAZOLIN SODIUM-DEXTROSE 2-4 GM/100ML-% IV SOLN
INTRAVENOUS | Status: AC
  Filled 2021-08-09: qty 100

## 2021-08-09 MED ORDER — TRAMADOL HCL 50 MG PO TABS
ORAL_TABLET | ORAL | Status: AC
  Filled 2021-08-09: qty 1

## 2021-08-09 MED ORDER — BUPIVACAINE HCL (PF) 0.5 % IJ SOLN
INTRAMUSCULAR | Status: DC | PRN
  Administered 2021-08-09: 2.5 mL via INTRATHECAL

## 2021-08-09 MED ORDER — PROPOFOL 10 MG/ML IV BOLUS
INTRAVENOUS | Status: AC
  Filled 2021-08-09: qty 40

## 2021-08-09 MED ORDER — BUPIVACAINE-EPINEPHRINE (PF) 0.25% -1:200000 IJ SOLN
INTRAMUSCULAR | Status: AC
  Filled 2021-08-09: qty 30

## 2021-08-09 MED ORDER — SODIUM CHLORIDE 0.9 % IR SOLN: Status: DC | PRN

## 2021-08-09 MED ORDER — MORPHINE SULFATE (PF) 4 MG/ML IV SOLN
0.5000 mg | INTRAVENOUS | Status: DC | PRN
  Administered 2021-08-09: 1 mg via INTRAVENOUS

## 2021-08-09 MED ORDER — SODIUM CHLORIDE FLUSH 0.9 % IV SOLN
INTRAVENOUS | Status: AC
  Filled 2021-08-09: qty 40

## 2021-08-09 MED ORDER — HYDROCODONE-ACETAMINOPHEN 7.5-325 MG PO TABS
ORAL_TABLET | ORAL | Status: AC
  Filled 2021-08-09: qty 1

## 2021-08-09 MED ORDER — OXYCODONE HCL 5 MG PO TABS: 5.0000 mg | ORAL_TABLET | Freq: Once | ORAL | Status: DC | PRN

## 2021-08-09 MED ORDER — CEFAZOLIN SODIUM-DEXTROSE 2-4 GM/100ML-% IV SOLN: 2.0000 g | Freq: Four times a day (QID) | INTRAVENOUS | Status: AC

## 2021-08-09 MED ORDER — HYDROCODONE-ACETAMINOPHEN 5-325 MG PO TABS
ORAL_TABLET | ORAL | Status: AC
  Filled 2021-08-09: qty 2

## 2021-08-09 MED ORDER — CHLORHEXIDINE GLUCONATE 0.12 % MT SOLN: 15.0000 mL | Freq: Once | OROMUCOSAL | Status: AC

## 2021-08-09 MED ORDER — ONDANSETRON HCL 4 MG/2ML IJ SOLN
INTRAMUSCULAR | Status: AC
  Filled 2021-08-09: qty 2

## 2021-08-09 MED ORDER — APREPITANT 40 MG PO CAPS: 40.0000 mg | ORAL_CAPSULE | Freq: Once | ORAL | Status: AC

## 2021-08-09 MED ORDER — SENNOSIDES-DOCUSATE SODIUM 8.6-50 MG PO TABS
1.0000 | ORAL_TABLET | Freq: Every evening | ORAL | Status: DC | PRN
  Filled 2021-08-09: qty 1

## 2021-08-09 MED ORDER — APREPITANT 40 MG PO CAPS
ORAL_CAPSULE | ORAL | Status: AC
  Administered 2021-08-09: 40 mg via ORAL
  Filled 2021-08-09: qty 1

## 2021-08-09 MED ORDER — ALUM & MAG HYDROXIDE-SIMETH 200-200-20 MG/5ML PO SUSP: 30.0000 mL | ORAL | Status: DC | PRN

## 2021-08-09 MED ORDER — KETOROLAC TROMETHAMINE 30 MG/ML IJ SOLN
INTRAMUSCULAR | Status: AC
  Filled 2021-08-09: qty 1

## 2021-08-09 MED ORDER — DIPHENHYDRAMINE HCL 12.5 MG/5ML PO ELIX
12.5000 mg | ORAL_SOLUTION | ORAL | Status: DC | PRN
  Filled 2021-08-09: qty 10

## 2021-08-09 MED ORDER — DULOXETINE HCL 60 MG PO CPEP: 60.0000 mg | ORAL_CAPSULE | Freq: Every day | ORAL | Status: DC

## 2021-08-09 MED ORDER — METHOCARBAMOL 1000 MG/10ML IJ SOLN
500.0000 mg | Freq: Four times a day (QID) | INTRAVENOUS | Status: DC | PRN
  Filled 2021-08-09: qty 5

## 2021-08-09 MED ORDER — METHOCARBAMOL 500 MG PO TABS
ORAL_TABLET | ORAL | Status: AC
  Filled 2021-08-09: qty 1

## 2021-08-09 MED ORDER — MENTHOL 3 MG MT LOZG
1.0000 | LOZENGE | OROMUCOSAL | Status: DC | PRN
  Filled 2021-08-09: qty 9

## 2021-08-09 MED ORDER — ACETAMINOPHEN 500 MG PO TABS: 1000.0000 mg | ORAL_TABLET | Freq: Once | ORAL | Status: AC

## 2021-08-09 MED ORDER — HYDROCODONE-ACETAMINOPHEN 5-325 MG PO TABS
1.0000 | ORAL_TABLET | ORAL | Status: DC | PRN
  Administered 2021-08-09: 2 via ORAL

## 2021-08-09 MED ORDER — HYDROCODONE-ACETAMINOPHEN 7.5-325 MG PO TABS
ORAL_TABLET | ORAL | Status: AC
  Administered 2021-08-09: 1 via ORAL
  Filled 2021-08-09: qty 1

## 2021-08-09 MED ORDER — GABAPENTIN 300 MG PO CAPS
900.0000 mg | ORAL_CAPSULE | Freq: Two times a day (BID) | ORAL | Status: DC
  Administered 2021-08-09: 900 mg via ORAL

## 2021-08-09 MED ORDER — BUPIVACAINE HCL (PF) 0.5 % IJ SOLN
INTRAMUSCULAR | Status: AC
  Filled 2021-08-09: qty 10

## 2021-08-09 MED ORDER — MIDAZOLAM HCL 5 MG/5ML IJ SOLN
INTRAMUSCULAR | Status: DC | PRN
Start: 2021-08-09 — End: 2021-08-09
  Administered 2021-08-09: 2 mg via INTRAVENOUS

## 2021-08-09 MED ORDER — METOCLOPRAMIDE HCL 5 MG/ML IJ SOLN: 5.0000 mg | Freq: Three times a day (TID) | INTRAMUSCULAR | Status: DC | PRN

## 2021-08-09 MED ORDER — ONDANSETRON HCL 4 MG/2ML IJ SOLN
4.0000 mg | Freq: Four times a day (QID) | INTRAMUSCULAR | Status: DC | PRN
  Administered 2021-08-10: 4 mg via INTRAVENOUS

## 2021-08-09 MED ORDER — ACETAMINOPHEN 10 MG/ML IV SOLN: 1000.0000 mg | Freq: Once | INTRAVENOUS | Status: DC | PRN

## 2021-08-09 MED ORDER — SODIUM CHLORIDE 0.9 % IR SOLN
Status: DC | PRN
  Administered 2021-08-09: 1000 mL

## 2021-08-09 MED ORDER — ONDANSETRON HCL 4 MG PO TABS
4.0000 mg | ORAL_TABLET | Freq: Four times a day (QID) | ORAL | Status: DC | PRN
  Administered 2021-08-09: 4 mg via ORAL
  Filled 2021-08-09: qty 1

## 2021-08-09 MED ORDER — PRONTOSAN WOUND IRRIGATION OPTIME
TOPICAL | Status: DC | PRN
Start: 2021-08-09 — End: 2021-08-09
  Administered 2021-08-09: 1

## 2021-08-09 MED ORDER — ENOXAPARIN SODIUM 30 MG/0.3ML IJ SOSY
30.0000 mg | PREFILLED_SYRINGE | Freq: Two times a day (BID) | INTRAMUSCULAR | Status: DC
  Administered 2021-08-10: 30 mg via SUBCUTANEOUS
  Filled 2021-08-09 (×2): qty 0.3

## 2021-08-09 MED ORDER — PROMETHAZINE HCL 25 MG/ML IJ SOLN: 6.2500 mg | INTRAMUSCULAR | Status: DC | PRN

## 2021-08-09 MED ORDER — METHOCARBAMOL 500 MG PO TABS
ORAL_TABLET | ORAL | Status: AC
  Administered 2021-08-09: 500 mg via ORAL
  Filled 2021-08-09: qty 1

## 2021-08-09 MED ORDER — ACETAMINOPHEN 325 MG PO TABS: 325.0000 mg | ORAL_TABLET | Freq: Four times a day (QID) | ORAL | Status: DC | PRN

## 2021-08-09 MED ORDER — DOCUSATE SODIUM 100 MG PO CAPS
100.0000 mg | ORAL_CAPSULE | Freq: Two times a day (BID) | ORAL | Status: DC
  Administered 2021-08-09 (×3): 100 mg via ORAL

## 2021-08-09 MED ORDER — BUPIVACAINE LIPOSOME 1.3 % IJ SUSP
INTRAMUSCULAR | Status: AC
  Filled 2021-08-09: qty 20

## 2021-08-09 MED ORDER — GABAPENTIN 300 MG PO CAPS
ORAL_CAPSULE | ORAL | Status: AC
  Filled 2021-08-09: qty 3

## 2021-08-09 MED ORDER — FAMOTIDINE 20 MG PO TABS
ORAL_TABLET | ORAL | Status: AC
  Administered 2021-08-09: 20 mg via ORAL
  Filled 2021-08-09: qty 1

## 2021-08-09 MED ORDER — ONDANSETRON HCL 4 MG PO TABS
ORAL_TABLET | ORAL | Status: AC
  Filled 2021-08-09: qty 1

## 2021-08-09 MED ORDER — SODIUM CHLORIDE (PF) 0.9 % IJ SOLN
INTRAMUSCULAR | Status: DC | PRN
  Administered 2021-08-09: 92 mL via INTRAMUSCULAR

## 2021-08-09 MED ORDER — LIDOCAINE HCL (CARDIAC) PF 100 MG/5ML IV SOSY
PREFILLED_SYRINGE | INTRAVENOUS | Status: DC | PRN
  Administered 2021-08-09: 50 mg via INTRAVENOUS

## 2021-08-09 MED ORDER — ORAL CARE MOUTH RINSE: 15.0000 mL | Freq: Once | OROMUCOSAL | Status: AC

## 2021-08-09 MED ORDER — TRANEXAMIC ACID-NACL 1000-0.7 MG/100ML-% IV SOLN
INTRAVENOUS | Status: DC | PRN
  Administered 2021-08-09: 1000 mg via INTRAVENOUS

## 2021-08-09 MED ORDER — DOCUSATE SODIUM 100 MG PO CAPS
ORAL_CAPSULE | ORAL | Status: AC
  Filled 2021-08-09: qty 1

## 2021-08-09 MED ORDER — FENTANYL CITRATE (PF) 100 MCG/2ML IJ SOLN: 25.0000 ug | INTRAMUSCULAR | Status: DC | PRN

## 2021-08-09 MED ORDER — FAMOTIDINE 20 MG PO TABS: 20.0000 mg | ORAL_TABLET | Freq: Once | ORAL | Status: AC

## 2021-08-09 MED ORDER — TRANEXAMIC ACID 1000 MG/10ML IV SOLN
INTRAVENOUS | Status: AC
  Filled 2021-08-09: qty 10

## 2021-08-09 MED ORDER — PANTOPRAZOLE SODIUM 40 MG PO TBEC
40.0000 mg | DELAYED_RELEASE_TABLET | Freq: Every day | ORAL | Status: DC
  Administered 2021-08-09: 40 mg via ORAL

## 2021-08-09 MED ORDER — ACETAMINOPHEN 500 MG PO TABS
ORAL_TABLET | ORAL | Status: AC
  Administered 2021-08-09: 1000 mg via ORAL
  Filled 2021-08-09: qty 2

## 2021-08-09 MED ORDER — DULOXETINE HCL 60 MG PO CPEP
60.0000 mg | ORAL_CAPSULE | Freq: Every day | ORAL | Status: DC
  Administered 2021-08-10: 60 mg via ORAL
  Filled 2021-08-09: qty 1

## 2021-08-09 MED ORDER — BISACODYL 5 MG PO TBEC
5.0000 mg | DELAYED_RELEASE_TABLET | Freq: Every day | ORAL | Status: DC | PRN
  Filled 2021-08-09: qty 1

## 2021-08-09 MED ORDER — SODIUM CHLORIDE 0.9 % IV SOLN: INTRAVENOUS | Status: DC

## 2021-08-09 MED ORDER — NEOMYCIN-POLYMYXIN B GU 40-200000 IR SOLN
Status: AC
  Filled 2021-08-09: qty 20

## 2021-08-09 MED ORDER — CHLORHEXIDINE GLUCONATE 0.12 % MT SOLN
OROMUCOSAL | Status: AC
  Administered 2021-08-09: 15 mL via OROMUCOSAL
  Filled 2021-08-09: qty 15

## 2021-08-09 MED ORDER — LACTATED RINGERS IV SOLN: INTRAVENOUS | Status: DC

## 2021-08-09 MED ORDER — MORPHINE SULFATE (PF) 2 MG/ML IV SOLN
INTRAVENOUS | Status: AC
  Filled 2021-08-09: qty 1

## 2021-08-09 MED ORDER — PROPOFOL 500 MG/50ML IV EMUL
INTRAVENOUS | Status: DC | PRN
  Administered 2021-08-09: 50 ug/kg/min via INTRAVENOUS

## 2021-08-09 MED ORDER — HYDROCODONE-ACETAMINOPHEN 7.5-325 MG PO TABS
1.0000 | ORAL_TABLET | ORAL | Status: DC | PRN
  Administered 2021-08-09: 1 via ORAL
  Administered 2021-08-10: 2 via ORAL
  Filled 2021-08-09 (×2): qty 2

## 2021-08-09 MED ORDER — METHOCARBAMOL 500 MG PO TABS
500.0000 mg | ORAL_TABLET | Freq: Four times a day (QID) | ORAL | Status: DC | PRN
  Administered 2021-08-09 – 2021-08-10 (×2): 500 mg via ORAL

## 2021-08-09 MED ORDER — PANTOPRAZOLE SODIUM 40 MG PO TBEC
DELAYED_RELEASE_TABLET | ORAL | Status: AC
  Filled 2021-08-09: qty 1

## 2021-08-09 MED ORDER — ONDANSETRON HCL 4 MG/2ML IJ SOLN
INTRAMUSCULAR | Status: DC | PRN
  Administered 2021-08-09: 4 mg via INTRAVENOUS

## 2021-08-09 MED ORDER — METOCLOPRAMIDE HCL 5 MG PO TABS
5.0000 mg | ORAL_TABLET | Freq: Three times a day (TID) | ORAL | Status: DC | PRN
  Filled 2021-08-09: qty 2

## 2021-08-09 MED ORDER — OXYCODONE HCL 5 MG/5ML PO SOLN: 5.0000 mg | Freq: Once | ORAL | Status: DC | PRN

## 2021-08-09 MED ORDER — CEFAZOLIN SODIUM-DEXTROSE 2-4 GM/100ML-% IV SOLN
2.0000 g | INTRAVENOUS | Status: AC
  Administered 2021-08-09: 2 g via INTRAVENOUS

## 2021-08-09 MED ORDER — FLEET ENEMA 7-19 GM/118ML RE ENEM: 1.0000 | ENEMA | Freq: Once | RECTAL | Status: DC | PRN

## 2021-08-09 MED ORDER — PHENOL 1.4 % MT LIQD
1.0000 | OROMUCOSAL | Status: DC | PRN
  Filled 2021-08-09: qty 177

## 2021-08-09 SURGICAL SUPPLY — 69 items
BLADE SAGITTAL 25.0X1.19X90 (BLADE) ×2 IMPLANT
BLADE SAW 90X13X1.19 OSCILLAT (BLADE) ×2 IMPLANT
BNDG ELASTIC 6X5.8 VLCR STR LF (GAUZE/BANDAGES/DRESSINGS) ×2 IMPLANT
CANISTER WOUND CARE 500ML ATS (WOUND CARE) ×2 IMPLANT
CEMENT HV SMART SET (Cement) ×4 IMPLANT
CHLORAPREP W/TINT 26 (MISCELLANEOUS) ×4 IMPLANT
COOLER POLAR GLACIER W/PUMP (MISCELLANEOUS) ×2 IMPLANT
CUFF TOURN SGL QUICK 24 (TOURNIQUET CUFF)
CUFF TOURN SGL QUICK 34 (TOURNIQUET CUFF)
CUFF TRNQT CYL 24X4X16.5-23 (TOURNIQUET CUFF) IMPLANT
CUFF TRNQT CYL 34X4.125X (TOURNIQUET CUFF) IMPLANT
DRAPE 3/4 80X56 (DRAPES) ×4 IMPLANT
DRSG MEPILEX SACRM 8.7X9.8 (GAUZE/BANDAGES/DRESSINGS) ×2 IMPLANT
ELECT CAUTERY BLADE 6.4 (BLADE) ×2 IMPLANT
ELECT REM PT RETURN 9FT ADLT (ELECTROSURGICAL) ×2
ELECTRODE REM PT RTRN 9FT ADLT (ELECTROSURGICAL) ×1 IMPLANT
FEMORAL COMP SZ3P RT SPHERE (Femur) ×1 IMPLANT
GAUZE SPONGE 4X4 12PLY STRL (GAUZE/BANDAGES/DRESSINGS) ×2 IMPLANT
GAUZE XEROFORM 1X8 LF (GAUZE/BANDAGES/DRESSINGS) ×2 IMPLANT
GLOVE SURG ORTHO LTX SZ8 (GLOVE) ×2 IMPLANT
GLOVE SURG SYN 9.0  PF PI (GLOVE) ×1
GLOVE SURG SYN 9.0 PF PI (GLOVE) ×1 IMPLANT
GLOVE SURG UNDER LTX SZ8 (GLOVE) ×2 IMPLANT
GLOVE SURG UNDER POLY LF SZ9 (GLOVE) ×2 IMPLANT
GOWN SRG 2XL LVL 4 RGLN SLV (GOWNS) ×1 IMPLANT
GOWN STRL NON-REIN 2XL LVL4 (GOWNS) ×1
GOWN STRL REUS W/ TWL LRG LVL3 (GOWN DISPOSABLE) ×1 IMPLANT
GOWN STRL REUS W/ TWL XL LVL3 (GOWN DISPOSABLE) ×1 IMPLANT
GOWN STRL REUS W/TWL LRG LVL3 (GOWN DISPOSABLE) ×1
GOWN STRL REUS W/TWL XL LVL3 (GOWN DISPOSABLE) ×1
HOLDER FOLEY CATH W/STRAP (MISCELLANEOUS) ×2 IMPLANT
INSERT TIBIAL SZ 3 HT 10 R (Insert) ×1 IMPLANT
IV NS 1000ML (IV SOLUTION) ×1
IV NS 1000ML BAXH (IV SOLUTION) IMPLANT
IV NS IRRIG 3000ML ARTHROMATIC (IV SOLUTION) ×2 IMPLANT
KIT PREVENA INCISION MGT20CM45 (CANNISTER) ×2 IMPLANT
KIT TURNOVER KIT A (KITS) ×2 IMPLANT
MANIFOLD NEPTUNE II (INSTRUMENTS) ×2 IMPLANT
NDL SAFETY ECLIPSE 18X1.5 (NEEDLE) ×1 IMPLANT
NDL SPNL 18GX3.5 QUINCKE PK (NEEDLE) ×1 IMPLANT
NDL SPNL 20GX3.5 QUINCKE YW (NEEDLE) ×1 IMPLANT
NEEDLE HYPO 18GX1.5 SHARP (NEEDLE) ×1
NEEDLE SPNL 18GX3.5 QUINCKE PK (NEEDLE) ×2 IMPLANT
NEEDLE SPNL 20GX3.5 QUINCKE YW (NEEDLE) ×2 IMPLANT
NS IRRIG 1000ML POUR BTL (IV SOLUTION) ×2 IMPLANT
PACK TOTAL KNEE (MISCELLANEOUS) ×2 IMPLANT
PAD WRAPON POLAR KNEE (MISCELLANEOUS) ×1 IMPLANT
PATELLA RESURFACING MEDACTA 02 (Bone Implant) ×1 IMPLANT
PENCIL SMOKE EVACUATOR COATED (MISCELLANEOUS) ×2 IMPLANT
PULSAVAC PLUS IRRIG FAN TIP (DISPOSABLE) ×2
SCALPEL PROTECTED #10 DISP (BLADE) ×4 IMPLANT
SOLUTION PRONTOSAN WOUND 350ML (IRRIGATION / IRRIGATOR) ×1 IMPLANT
STAPLER SKIN PROX 35W (STAPLE) ×2 IMPLANT
STEM EXTENSION 11MMX30MM (Stem) ×1 IMPLANT
SUCTION FRAZIER HANDLE 10FR (MISCELLANEOUS) ×1
SUCTION TUBE FRAZIER 10FR DISP (MISCELLANEOUS) ×1 IMPLANT
SUT DVC 2 QUILL PDO  T11 36X36 (SUTURE) ×1
SUT DVC 2 QUILL PDO T11 36X36 (SUTURE) ×1 IMPLANT
SUT ETHIBOND 2 V 37 (SUTURE) IMPLANT
SUT V-LOC 90 ABS DVC 3-0 CL (SUTURE) ×2 IMPLANT
SYR 20ML LL LF (SYRINGE) ×2 IMPLANT
SYR 50ML LL SCALE MARK (SYRINGE) ×4 IMPLANT
TIP FAN IRRIG PULSAVAC PLUS (DISPOSABLE) ×1 IMPLANT
TOWEL OR 17X26 4PK STRL BLUE (TOWEL DISPOSABLE) ×2 IMPLANT
TOWER CARTRIDGE SMART MIX (DISPOSABLE) ×2 IMPLANT
TRAY FOLEY MTR SLVR 16FR STAT (SET/KITS/TRAYS/PACK) ×2 IMPLANT
TRAY TIB FX RT SZ 3 (Joint) ×1 IMPLANT
WATER STERILE IRR 500ML POUR (IV SOLUTION) ×2 IMPLANT
WRAPON POLAR PAD KNEE (MISCELLANEOUS) ×2

## 2021-08-09 NOTE — Anesthesia Procedure Notes (Signed)
Date/Time: 08/09/2021 7:30 AM Performed by: Cammie Sickle, CRNA Oxygen Delivery Method: Simple face mask Comments: 5 liters O2 via simple face mask

## 2021-08-09 NOTE — Anesthesia Procedure Notes (Addendum)
Spinal  Patient location during procedure: OR Start time: 08/09/2021 7:22 AM End time: 08/09/2021 7:29 AM Reason for block: surgical anesthesia Staffing Performed: resident/CRNA  Anesthesiologist: Iran Ouch, MD Resident/CRNA: Cammie Sickle, CRNA Preanesthetic Checklist Completed: patient identified, IV checked, site marked, risks and benefits discussed, surgical consent, monitors and equipment checked, pre-op evaluation and timeout performed Spinal Block Patient position: sitting Prep: DuraPrep Patient monitoring: heart rate, cardiac monitor, continuous pulse ox and blood pressure Approach: midline Location: L3-4 Injection technique: single-shot Needle Needle type: Spinocan  Needle gauge: 24 G Needle length: 4 cm. Assessment Sensory level: T10 Events: CSF return Additional Notes Pt. Tolerated procedure well, no complications noted.

## 2021-08-09 NOTE — Transfer of Care (Signed)
Immediate Anesthesia Transfer of Care Note  Patient: Brooke Molina  Procedure(s) Performed: TOTAL KNEE ARTHROPLASTY (Right: Knee)  Patient Location: PACU  Anesthesia Type:Spinal  Level of Consciousness: awake and drowsy  Airway & Oxygen Therapy: Patient Spontanous Breathing  Post-op Assessment: Report given to RN and Post -op Vital signs reviewed and stable  Post vital signs: Reviewed and stable  Last Vitals:  Vitals Value Taken Time  BP 111/70 08/09/21 0930  Temp    Pulse 77 08/09/21 0930  Resp 17 08/09/21 0930  SpO2 98 % 08/09/21 0930  Vitals shown include unvalidated device data.  Last Pain:  Vitals:   08/09/21 0624  TempSrc: Oral  PainSc: 5          Complications: No notable events documented.

## 2021-08-09 NOTE — H&P (Signed)
Chief Complaint  Patient presents with   Pre-op Exam  Scheduled for Right TKA 08/09/21 by with Dr. Rudene Christians    History of the Present Illness: Brooke Molina is a 46 y.o. female here today for history and physical for right total knee arthroplasty with Dr. Hessie Knows on 08/09/2021. Patient has had years of progressive right knee pain. X-rays show advanced osteoarthritis most severe in the patellofemoral and lateral compartments. She has slight valgus deformity. She is had cortisone injections with short-term relief. She is very active and knee pain is becoming so severe that she is unable to work and perform her normal activities of daily living. She has a hard time squatting bending and walking at work. Her knee will frequently buckle give way and swell. Has a hard time bending the knee. Right knee pain is generalized mostly along the lateral patellofemoral compartment.  I have reviewed past medical, surgical, social and family history, and allergies as documented in the EMR.  The patient is employed at [employer].   Past Medical History: Past Medical History:  Diagnosis Date   Anxiety, generalized 09/11/2018   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CMS-HCC)   Eczema of lower leg   Primary osteoarthritis of both knees   Tobacco use   Past Surgical History: Past Surgical History:  Procedure Laterality Date   ear tubes [place]   lymph node removal Left 2013  left cervical lymph node   mole biopsy 08/2020   REPLACEMENT TOTAL KNEE Right   Past Family History: Family History  Problem Relation Age of Onset   Atrial fibrillation (Abnormal heart rhythm sometimes requiring treatment with blood thinners) Mother   Epilepsy Mother   Endometriosis Sister   Breast cancer Maternal Aunt   Rheum arthritis Maternal Aunt   Coronary Artery Disease (Blocked arteries around heart) Maternal Grandfather   Osteoarthritis Maternal Grandfather   Arthritis Father    Osteoarthritis Maternal Grandmother   Osteoarthritis Paternal Grandmother   Osteoarthritis Paternal Grandfather   Medications: Current Outpatient Medications Ordered in Epic  Medication Sig Dispense Refill   dextrose 5 % SolP 250 mL with siltuximab 100 mg SolR 11 mg/kg chemo infusion Inject into the vein Every 3 weeks   DULoxetine (CYMBALTA) 60 MG DR capsule Take 60 mg by mouth once daily   gabapentin (NEURONTIN) 300 MG capsule Take 3 capsules (900 mg total) by mouth 2 (two) times daily 180 capsule 11   hydrOXYchloroQUINE (PLAQUENIL) 200 mg tablet Take 1 tablet (200 mg total) by mouth 2 (two) times daily 180 tablet 1   siltuximab (SYLVANT) 400 mg injection Inject 11 mg/kg into the vein every 21 (twenty-one) days Every 3 weeks   traZODone (DESYREL) 50 MG tablet Take 50-100 mg by mouth nightly as needed   No current Epic-ordered facility-administered medications on file.   Allergies: No Known Allergies   Body mass index is 34.44 kg/m.  Review of Systems: A comprehensive 14 point ROS was performed, reviewed, and the pertinent orthopaedic findings are documented in the HPI.  Vitals:  08/01/21 0817  BP: 104/64    General Physical Examination:   General:  Well developed, well nourished, no apparent distress, normal affect, mildly antalgic gait with no assistive devices.  HEENT: Head normocephalic, atraumatic, PERRL.   Abdomen: Soft, non tender, non distended, Bowel sounds present.  Heart: Examination of the heart reveals regular, rate, and rhythm. There is no murmur noted on ascultation. There is a normal apical pulse.  Lungs: Lungs  are clear to auscultation. There is no wheeze, rhonchi, or crackles. There is normal expansion of bilateral chest walls.   Right knee: On exam, right knee range of motion of 0- 90 degrees. Crepitation to the patellofemoral joint, medial and lateral.  Radiographs:  X-rays of the right knee reviewed by me today from 11/09/2020 show  tricompartmental osteoarthritis most severe in the lateral and patellofemoral compartments. Mild sclerotic changes and spurring along the medial tibial plateau with spurring and sclerotic changes along the lateral tibial plateau. No evidence of acute bony abnormality.  Assessment: ICD-10-CM  1. Primary osteoarthritis of right knee M17.11  2. Neuropathy G62.9  3. B12 deficiency E53.8   Plan:  62. 46 year old female with advanced right knee osteoarthritis. She has had years of pain that has interfered with quality of life and activities daily living. She is tried conservative treatment with very little relief. She is had successful left total knee arthroplasty. She is ready to proceed with a right total knee arthroplasty. Risks, benefits, complications of a right total knee arthroplasty have been discussed with the patient. Patient has agreed and consented the procedure with Dr. Hessie Knows on 08/09/2021   Electronically signed by Feliberto Gottron, PA at 08/01/2021 8:51 AM EST  Reviewed  H+P. No changes noted.

## 2021-08-09 NOTE — TOC Progression Note (Signed)
Transition of Care [facility]) - Progression Note    Patient Details  Name: Brooke Molina MRN: [id number] Date of Birth: 12-29-1975  Transition of Care [facility]) CM/SW [place], RN Phone Number: 08/09/2021, 2:26 PM  Clinical Narrative:    Patient is set up with [facility]  for [facility], PT to eval        Expected Discharge Plan and Services                                                 Social Determinants of Health (SDOH) Interventions    Readmission Risk Interventions No flowsheet data found.

## 2021-08-09 NOTE — Op Note (Signed)
08/09/2021  9:32 AM  PATIENT:  Brooke Molina   MRN: [id number]  PRE-OPERATIVE DIAGNOSIS:  Primary localized osteoarthritis of right knee   POST-OPERATIVE DIAGNOSIS:  Same   PROCEDURE:  Procedure(s): Right TOTAL KNEE ARTHROPLASTY   SURGEON: Laurene Footman, MD   ASSISTANTS: Rachelle Hora, PA-C   ANESTHESIA:   spinal   EBL: 200   BLOOD ADMINISTERED:none   DRAINS:  Incisional wound VAC     LOCAL MEDICATIONS USED:  MARCAINE    and OTHER Exparel morphine and Toradol   SPECIMEN:  No Specimen   DISPOSITION OF SPECIMEN:  N/A   COUNTS:  YES   TOURNIQUET: 27 minutes at 300 mm Hg   IMPLANTS: Medacta  GMK sphere system with 3+ right femur, 3 right tibia with short stem and 10 mm insert.  Size 2 patella, all components cemented.   DICTATION: Viviann Spare Dictation   patient was brought to the operating room and spinal anesthesia was obtained.  After prepping and draping the right leg in sterile fashion, and after patient identification and timeout procedures were completed, tourniquet was not raised until after all bone cuts have been completed and midline skin incision was made followed by medial parapatellar arthrotomy with severe medial compartment osteoarthritis, severe patellofemoral arthritis and mild lateral compartment arthritis, partial synovectomy was also carried out.   The ACL and PCL and fat pad were excised along with anterior horns of the meniscus. The proximal tibia cutting guide from  the extra medullary system was applied and the proximal tibia cut carried out.  The distal femoral cut was carried out in a similar fashion using intramedullary guide at 5 degrees     the 3+ femoral cutting guide applied with anterior posterior and chamfer cuts made.  The posterior horns of the menisci were removed at this point.   Injection of the above medication was carried out after the femoral and tibial cuts were carried out.  The 3 right baseplate trial was placed pinned into position and  proximal tibial preparation carried out with drilling hand reaming and the keel punch followed by placement of the 3+ femur and sizing the tibial insert size 10 millimeter gave the best fit with stability and full extension.  The distal femoral drill holes were made in the notch cut for the trochlear groove was then carried out with trials were then removed the patella was cut using the patellar cutting guide and it sized to a size 2 after drill holes have been made  The knee was irrigated with pulsatile lavage and the bony surfaces dried the tibial component was cemented into place first.  Excess cement was removed and the polyethylene insert placed with a torque screw placed with a torque screwdriver tightened.  The distal femoral component was placed and the knee was held in extension as the patellar button was clamped into place.  After the cement was set, excess cement was removed and the knee was again irrigated thoroughly thoroughly irrigated.  The tourniquet was let down and hemostasis checked with electrocautery. The arthrotomy was repaired with a heavy Quill suture,  followed by 3-0 V lock subcuticular closure, skin staples followed by incisional wound VAC and Polar Care.Marland Kitchen   PLAN OF CARE: Admit for overnight observation   PATIENT DISPOSITION:  PACU - hemodynamically stable.

## 2021-08-09 NOTE — Evaluation (Signed)
Physical Therapy Evaluation Patient Details Name: Brooke Molina MRN: [id number] DOB: 11/03/1975 Today's Date: 08/09/2021  History of Present Illness  Pt is a 46 y/o F with R knee OA and is s/p elective R TKA. Had L TKA on 09/23/19. PMH includes: Castleman Disease, OA, Anxiety, Obesity.   Clinical Impression  Pt resting in bed upon PT entrance into room for POD#0 evaluation. She is A&Ox4 and reports pain is 6/10 at rest. She reports she lives in a 1-story home w/ her husband and son, and was independent w/ ADLs prior to surgery. She reports she had no notable complications following her L TKA back in March of 2021.   Pt was able to perform Total Joint Exercises while supine in bed w/o any complaints of increase in pain. After completion, she was able to perform bed mobility w/ SUPERVISION and was able to perform sit to stand w/ SUPERVISION using RW. Verbal cues were required for proper UE placement for optimal utilization and safety w/ sit to stand transfer. Once standing she was able to ambulate ~143ft using RW and w/ CGA, and returned back to recliner w/o any significant increased complaints of pain. Pt will benefit from continued skilled PT in order to improve LE strength, mobility, decrease c/o, and restore PLOF. Current discharge recommendation remains appropriate due to the level of assistance required by the patient to ensure safety and improve overall function.     Recommendations for follow up therapy are one component of a multi-disciplinary discharge planning process, led by the attending physician.  Recommendations may be updated based on patient status, additional functional criteria and insurance authorization.  Follow Up Recommendations Home health PT    Assistance Recommended at Discharge Intermittent Supervision/Assistance  Patient can return home with the following  A little help with walking and/or transfers;A little help with bathing/dressing/bathroom;Assistance with  cooking/housework;Assist for transportation;Help with stairs or ramp for entrance    Equipment Recommendations Rolling walker (2 wheels)  Recommendations for Other Services       Functional Status Assessment Patient has had a recent decline in their functional status and demonstrates the ability to make significant improvements in function in a reasonable and predictable amount of time.     Precautions / Restrictions Precautions Precautions: Fall Restrictions Weight Bearing Restrictions: Yes RLE Weight Bearing: Weight bearing as tolerated      Mobility  Bed Mobility Overal bed mobility: Needs Assistance Bed Mobility: Supine to Sit     Supine to sit: Supervision          Transfers Overall transfer level: Needs assistance Equipment used: Rolling walker (2 wheels) Transfers: Sit to/from Stand, Bed to chair/wheelchair/BSC Sit to Stand: Supervision   Step pivot transfers: Supervision            Ambulation/Gait Ambulation/Gait assistance: Min guard Gait Distance (Feet): 125 Feet Assistive device: Rolling walker (2 wheels) Gait Pattern/deviations: Step-to pattern, Decreased step length - right, Decreased step length - left, Decreased stride length Gait velocity: decreased        Stairs            Wheelchair Mobility    Modified Rankin (Stroke Patients Only)       Balance Overall balance assessment: Needs assistance Sitting-balance support: Feet supported, Single extremity supported, No upper extremity supported Sitting balance-Leahy Scale: Normal     Standing balance support: During functional activity, Bilateral upper extremity supported Standing balance-Leahy Scale: Good  Pertinent Vitals/Pain Pain Assessment Pain Assessment: 0-10 Pain Score: 6  Pain Location: r knee Pain Descriptors / Indicators: Dull, Grimacing, Sore Pain Intervention(s): Limited activity within patient's tolerance, Monitored  during session, Premedicated before session, Repositioned    Home Living Family/patient expects to be discharged to:: Private residence Living Arrangements: Spouse/significant other;Children Available Help at Discharge: Family;Available 24 hours/day Type of Home: House Home Access: Stairs to enter Entrance Stairs-Rails: Right;Left;Can reach both Entrance Stairs-Number of Steps: 3   Home Layout: One level Home Equipment: None      Prior Function Prior Level of Function : Independent/Modified Independent                     Hand Dominance        Extremity/Trunk Assessment   Upper Extremity Assessment Upper Extremity Assessment: Overall WFL for tasks assessed    Lower Extremity Assessment Lower Extremity Assessment: Generalized weakness       Communication      Cognition Arousal/Alertness: Awake/alert Behavior During Therapy: WFL for tasks assessed/performed Overall Cognitive Status: Within Functional Limits for tasks assessed                                          General Comments      Exercises Total Joint Exercises Quad Sets: AROM, Both, 10 reps Heel Slides: AROM, Both, 10 reps Straight Leg Raises: AROM, Both, 10 reps   Assessment/Plan    PT Assessment Patient needs continued PT services  PT Problem List Decreased strength;Decreased mobility;Decreased range of motion;Decreased coordination;Decreased activity tolerance;Decreased balance;Pain       PT Treatment Interventions DME instruction;Therapeutic exercise;Gait training;Balance training;Stair training;Functional mobility training;Patient/family education;Therapeutic activities    PT Goals (Current goals can be found in the Care Plan section)  Acute Rehab PT Goals Patient Stated Goal: to go home and have my knee flexion at 100deg by my first PT appointment PT Goal Formulation: With patient Time For Goal Achievement: 08/23/21 Potential to Achieve Goals: Good    Frequency  BID     Co-evaluation               AM-PAC PT "6 Clicks" Mobility  Outcome Measure Help needed turning from your back to your side while in a flat bed without using bedrails?: None Help needed moving from lying on your back to sitting on the side of a flat bed without using bedrails?: None Help needed moving to and from a bed to a chair (including a wheelchair)?: None Help needed standing up from a chair using your arms (e.g., wheelchair or bedside chair)?: A Little Help needed to walk in hospital room?: A Little Help needed climbing 3-5 steps with a railing? : A Little 6 Click Score: 21    End of Session Equipment Utilized During Treatment: Gait belt Activity Tolerance: Patient tolerated treatment well;No increased pain Patient left: in chair;with call bell/phone within reach Nurse Communication: Mobility status PT Visit Diagnosis: Unsteadiness on feet (R26.81);Pain;Muscle weakness (generalized) (M62.81);Other abnormalities of gait and mobility (R26.89) Pain - Right/Left: Right Pain - part of body: Knee    Time: 3300-7622 PT Time Calculation (min) (ACUTE ONLY): 33 min   Charges:             Jonnie Kind, SPT 08/09/2021, 3:46 PM

## 2021-08-10 DIAGNOSIS — M1711 Unilateral primary osteoarthritis, right knee: Secondary | ICD-10-CM | POA: Diagnosis not present

## 2021-08-10 LAB — CBC
HCT: 31.8 % — ABNORMAL LOW (ref 36.0–46.0)
Hemoglobin: 10.8 g/dL — ABNORMAL LOW (ref 12.0–15.0)
MCH: 31.8 pg (ref 26.0–34.0)
MCHC: 34 g/dL (ref 30.0–36.0)
MCV: 93.5 fL (ref 80.0–100.0)
Platelets: 199 10*3/uL (ref 150–400)
RBC: 3.4 MIL/uL — ABNORMAL LOW (ref 3.87–5.11)
RDW: 13 % (ref 11.5–15.5)
WBC: 9.8 10*3/uL (ref 4.0–10.5)
nRBC: 0 % (ref 0.0–0.2)

## 2021-08-10 LAB — BASIC METABOLIC PANEL
Anion gap: 3 — ABNORMAL LOW (ref 5–15)
BUN: 9 mg/dL (ref 6–20)
CO2: 24 mmol/L (ref 22–32)
Calcium: 8 mg/dL — ABNORMAL LOW (ref 8.9–10.3)
Chloride: 108 mmol/L (ref 98–111)
Creatinine, Ser: 0.65 mg/dL (ref 0.44–1.00)
GFR, Estimated: >60 mL/min (ref >60)
Glucose, Bld: 92 mg/dL (ref 70–99)
Potassium: 3.8 mmol/L (ref 3.5–5.1)
Sodium: 135 mmol/L (ref 135–145)

## 2021-08-10 MED ORDER — SENNOSIDES-DOCUSATE SODIUM 8.6-50 MG PO TABS: 1.0000 | ORAL_TABLET | Freq: Every evening | ORAL | Status: DC | PRN

## 2021-08-10 MED ORDER — GABAPENTIN 300 MG PO CAPS
ORAL_CAPSULE | ORAL | Status: AC
  Administered 2021-08-10: 900 mg via ORAL
  Filled 2021-08-10: qty 3

## 2021-08-10 MED ORDER — TRAMADOL HCL 50 MG PO TABS: 50.0000 mg | ORAL_TABLET | Freq: Four times a day (QID) | ORAL | 0 refills | Status: DC | PRN

## 2021-08-10 MED ORDER — ENOXAPARIN SODIUM 40 MG/0.4ML IJ SOSY
PREFILLED_SYRINGE | INTRAMUSCULAR | Status: AC
  Filled 2021-08-10: qty 0.4

## 2021-08-10 MED ORDER — MORPHINE SULFATE (PF) 2 MG/ML IV SOLN
INTRAVENOUS | Status: AC
  Administered 2021-08-10: 1 mg via INTRAVENOUS
  Filled 2021-08-10: qty 1

## 2021-08-10 MED ORDER — ONDANSETRON HCL 4 MG/2ML IJ SOLN
INTRAMUSCULAR | Status: AC
  Filled 2021-08-10: qty 2

## 2021-08-10 MED ORDER — HYDROCODONE-ACETAMINOPHEN 7.5-325 MG PO TABS: 1.0000 | ORAL_TABLET | ORAL | 0 refills | Status: DC | PRN

## 2021-08-10 MED ORDER — PANTOPRAZOLE SODIUM 40 MG PO TBEC
DELAYED_RELEASE_TABLET | ORAL | Status: AC
  Administered 2021-08-10: 40 mg via ORAL
  Filled 2021-08-10: qty 1

## 2021-08-10 MED ORDER — DOCUSATE SODIUM 100 MG PO CAPS: 100.0000 mg | ORAL_CAPSULE | Freq: Two times a day (BID) | ORAL | 0 refills | Status: DC

## 2021-08-10 MED ORDER — DOCUSATE SODIUM 100 MG PO CAPS
ORAL_CAPSULE | ORAL | Status: AC
  Administered 2021-08-10: 100 mg via ORAL
  Filled 2021-08-10: qty 1

## 2021-08-10 MED ORDER — HYDROCODONE-ACETAMINOPHEN 7.5-325 MG PO TABS
ORAL_TABLET | ORAL | Status: AC
  Administered 2021-08-10: 2 via ORAL
  Filled 2021-08-10: qty 1

## 2021-08-10 MED ORDER — TRAMADOL HCL 50 MG PO TABS
ORAL_TABLET | ORAL | Status: AC
  Filled 2021-08-10: qty 1

## 2021-08-10 MED ORDER — HYDROCODONE-ACETAMINOPHEN 7.5-325 MG PO TABS
ORAL_TABLET | ORAL | Status: AC
  Filled 2021-08-10: qty 1

## 2021-08-10 MED ORDER — ENOXAPARIN SODIUM 40 MG/0.4ML IJ SOSY
40.0000 mg | PREFILLED_SYRINGE | INTRAMUSCULAR | 0 refills | Status: DC
Start: 2021-08-10 — End: 2023-08-29

## 2021-08-10 MED ORDER — METHOCARBAMOL 500 MG PO TABS
ORAL_TABLET | ORAL | Status: AC
  Filled 2021-08-10: qty 1

## 2021-08-10 MED ORDER — HYDROCODONE-ACETAMINOPHEN 7.5-325 MG PO TABS
ORAL_TABLET | ORAL | Status: AC
  Filled 2021-08-10: qty 2

## 2021-08-10 MED ORDER — METHOCARBAMOL 500 MG PO TABS: 500.0000 mg | ORAL_TABLET | Freq: Four times a day (QID) | ORAL | 0 refills | Status: DC | PRN

## 2021-08-10 MED ORDER — TRAMADOL HCL 50 MG PO TABS
ORAL_TABLET | ORAL | Status: AC
  Administered 2021-08-10: 50 mg via ORAL
  Filled 2021-08-10: qty 1

## 2021-08-10 MED ORDER — ONDANSETRON HCL 4 MG PO TABS: 4.0000 mg | ORAL_TABLET | Freq: Four times a day (QID) | ORAL | 0 refills | Status: DC | PRN

## 2021-08-10 NOTE — Progress Notes (Signed)
Patient has consumed several 2 liter diet cokes during shift

## 2021-08-10 NOTE — Discharge Instructions (Signed)

## 2021-08-10 NOTE — Anesthesia Postprocedure Evaluation (Signed)
Anesthesia Post Note  Patient: Brooke Molina  Procedure(s) Performed: TOTAL KNEE ARTHROPLASTY (Right: Knee)  Patient location during evaluation: PACU Anesthesia Type: Spinal Level of consciousness: awake and alert Pain management: pain level controlled Vital Signs Assessment: post-procedure vital signs reviewed and stable Respiratory status: spontaneous breathing, nonlabored ventilation and respiratory function stable Cardiovascular status: blood pressure returned to baseline and stable Postop Assessment: no apparent nausea or vomiting Anesthetic complications: no   No notable events documented.   Last Vitals:  Vitals:   08/10/21 0740 08/10/21 1133  BP: 118/67 95/64  Pulse: 60 68  Resp: 16 14  Temp: 36.7 C 36.7 C  SpO2: 96% 95%    Last Pain:  Vitals:   08/10/21 1133  TempSrc: Temporal  PainSc:                  Iran Ouch

## 2021-08-10 NOTE — Evaluation (Signed)
Occupational Therapy Evaluation Patient Details Name: Brooke Molina MRN: [id number] DOB: 26-Feb-1976 Today's Date: 08/10/2021   History of Present Illness Pt is a 46 y/o F with R knee OA and is s/p elective R TKA. Had L TKA on 09/23/19. PMH includes: Castleman Disease, OA, Anxiety, Obesity.   Clinical Impression   Ms. Garzon was seen for OT evaluation this date, POD#1 from above surgery. Pt was independent in all ADLs prior to surgery. She endorses working full time and driving. Denies falls history in the past 6 months. Pt is eager to return to PLOF with less pain and improved safety and independence. Pt performs UB/LB dressing and functional mobility with modified independence during session. Pt instructed in polar care mgt, falls prevention strategies, home/routines modifications, & DME/AE for LB bathing and dressing tasks. She demonstrates excellent recall/carryover from past OT sessions after her L TKA. She return demonstrates/verbalizes understanding of all education provided. No further skilled OT needs identified. OT to sign off. Do not currently anticipate any OT needs following this hospitalization.         Recommendations for follow up therapy are one component of a multi-disciplinary discharge planning process, led by the attending physician.  Recommendations may be updated based on patient status, additional functional criteria and insurance authorization.   Follow Up Recommendations  No OT follow up    Assistance Recommended at Discharge Set up Supervision/Assistance  Patient can return home with the following A little help with bathing/dressing/bathroom    Functional Status Assessment     Equipment Recommendations  None recommended by OT    Recommendations for Other Services       Precautions / Restrictions Precautions Precautions: Fall Restrictions Weight Bearing Restrictions: No RLE Weight Bearing: Weight bearing as tolerated      Mobility Bed  Mobility Overal bed mobility: Needs Assistance Bed Mobility: Supine to Sit, Sit to Supine     Supine to sit: Modified independent (Device/Increase time), HOB elevated Sit to supine: Modified independent (Device/Increase time), HOB elevated        Transfers Overall transfer level: Needs assistance Equipment used: Rolling walker (2 wheels) Transfers: Sit to/from Stand Sit to Stand: Modified independent (Device/Increase time)     Step pivot transfers: Modified independent (Device/Increase time)            Balance Overall balance assessment: Needs assistance Sitting-balance support: Feet supported, Single extremity supported, No upper extremity supported Sitting balance-Leahy Scale: Normal Sitting balance - Comments: Steady static/dynamic sitting at EOB during functional tasks.   Standing balance support: During functional activity, Bilateral upper extremity supported, Single extremity supported Standing balance-Leahy Scale: Good                             ADL either performed or assessed with clinical judgement   ADL Overall ADL's : Needs assistance/impaired                                       General ADL Comments: Pt performs bed/functional mobility with modified independence. She dons LB clothing (shoes & shorts) using RW for STS and min cueing for safety. Endorses her family plan to provide any ADL assistance she may need during recovery period.     Vision Baseline Vision/History: 1 Wears glasses Ability to See in Adequate Light: 1 Impaired Patient Visual Report: No change from baseline  Perception     Praxis      Pertinent Vitals/Pain Pain Assessment Pain Assessment: 0-10 Pain Score: 6  Pain Location: r knee Pain Descriptors / Indicators: Dull, Grimacing, Sore Pain Intervention(s): Limited activity within patient's tolerance, Monitored during session, Premedicated before session     Hand Dominance      Extremity/Trunk Assessment Upper Extremity Assessment Upper Extremity Assessment: Overall WFL for tasks assessed   Lower Extremity Assessment Lower Extremity Assessment: RLE deficits/detail;Defer to PT evaluation RLE Deficits / Details: s/p R TKA   Cervical / Trunk Assessment Cervical / Trunk Assessment: Normal   Communication Communication Communication: No difficulties   Cognition Arousal/Alertness: Awake/alert Behavior During Therapy: WFL for tasks assessed/performed Overall Cognitive Status: Within Functional Limits for tasks assessed                                       General Comments       Exercises Other Exercises Other Exercises: Pt educated in falls prevention strategies, safe use of AE/DME for LB ADL management, compression stocking management, polar care management, complementary alternative methods for pain management including gentle self-massage and distraction techniques, and routines modifications to support safety and fxl independence during meaningful occupations of daily life.   Shoulder Instructions      Home Living Family/patient expects to be discharged to:: Private residence Living Arrangements: Spouse/significant other;Children Available Help at Discharge: Family;Available 24 hours/day Type of Home: House Home Access: Stairs to enter [employer] of Steps: 3 Entrance Stairs-Rails: Right;Left;Can reach both Home Layout: One level     Bathroom Shower/Tub: Teacher, early years/pre: Standard     Home Equipment: Cane - single point          Prior Functioning/Environment Prior Level of Function : Independent/Modified Independent;Driving;Working/employed               ADLs Comments: Totally independent with ADL/IADL management.        OT Problem List: Pain;Decreased safety awareness;Impaired balance (sitting and/or standing);Decreased knowledge of use of DME or AE      OT Treatment/Interventions:       OT Goals(Current goals can be found in the care plan section) Acute Rehab OT Goals Patient Stated Goal: To go home today OT Goal Formulation: All assessment and education complete, DC therapy Time For Goal Achievement: 08/10/21 Potential to Achieve Goals: Good  OT Frequency:      Co-evaluation              AM-PAC OT "6 Clicks" Daily Activity     Outcome Measure Help from another person eating meals?: None Help from another person taking care of personal grooming?: None Help from another person toileting, which includes using toliet, bedpan, or urinal?: A Little Help from another person bathing (including washing, rinsing, drying)?: A Little Help from another person to put on and taking off regular upper body clothing?: None Help from another person to put on and taking off regular lower body clothing?: A Little 6 Click Score: 21   End of Session Equipment Utilized During Treatment: Rolling walker (2 wheels) Nurse Communication: Mobility status  Activity Tolerance: Patient tolerated treatment well Patient left: in bed;with call bell/phone within reach;with bed alarm set  OT Visit Diagnosis: Other abnormalities of gait and mobility (R26.89);Pain Pain - Right/Left: Right Pain - part of body: Knee  Time: 9794-8016 OT Time Calculation (min): 26 min Charges:  OT General Charges $OT Visit: 1 Visit OT Evaluation $OT Eval Low Complexity: 1 Low OT Treatments $Self Care/Home Management : 8-22 mins  Shara Blazing, M.S., OTR/L [facility] Ascom: [phone] 08/10/21, 10:33 AM

## 2021-08-10 NOTE — Discharge Summary (Signed)
Physician Discharge Summary  Patient ID: Brooke Molina MRN: [id number] DOB/AGE: 1976/01/14 46 y.o.  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 female who was admitted 08/09/2021 with a diagnosis of S/P TKR (total knee replacement) using cement, right and went to the operating room on 08/09/2021 and underwent the above named procedures.    Surgeries: Procedure(s): TOTAL KNEE ARTHROPLASTY on 08/09/2021 Patient tolerated the surgery well. Taken to PACU where she was stabilized and then transferred to the orthopedic floor.  Started on Lovenox 30 mg q 12 hrs. Foot pumps applied bilaterally at 80 mm. Heels elevated on bed with rolled towels. No evidence of DVT. Negative Homan. Physical therapy started on day #1 for gait training and transfer. OT started day #1 for ADL and assisted devices.  Patient's foley was d/c on day #1. Patient's IV  was d/c on day #1.  On post op day #1 patient was stable and ready for discharge to home with HHPT.   She was given perioperative antibiotics:  Anti-infectives (From admission, onward)    Start     Dose/Rate Route Frequency Ordered Stop   08/09/21 1300  ceFAZolin (ANCEF) IVPB 2g/100 mL premix        2 g 200 mL/hr over 30 Minutes Intravenous Every 6 hours 08/09/21 1024 08/09/21 1835   08/09/21 1030  hydroxychloroquine  (PLAQUENIL) tablet 200 mg        200 mg Oral 2 times daily 08/09/21 1024     08/09/21 0600  ceFAZolin (ANCEF) IVPB 2g/100 mL premix        2 g 200 mL/hr over 30 Minutes Intravenous On call to O.R. 08/09/21 0101 08/09/21 1103     .  She was given sequential compression devices, early ambulation, and Lovenox TEDs for DVT prophylaxis.  She benefited maximally from the hospital stay and there were no complications.    Recent vital signs:  Vitals:   08/10/21 0740 08/10/21 1133  BP: 118/67 95/64  Pulse: 60 68  Resp: 16 14  Temp: 98.1 F (36.7 C) 98.1 F (36.7 C)  SpO2: 96% 95%    Recent laboratory studies:  Lab Results  Component Value Date   HGB 10.8 (L) 08/10/2021   HGB 13.3 08/09/2021   HGB 14.0 07/28/2021   Lab Results  Component Value Date   WBC 9.8 08/10/2021   PLT 199 08/10/2021   No results found for: INR Lab Results  Component Value Date   NA 135 08/10/2021   K 3.8 08/10/2021   CL 108 08/10/2021   CO2 24 08/10/2021   BUN 9 08/10/2021   CREATININE 0.65 08/10/2021   GLUCOSE 92 08/10/2021    Discharge Medications:   Allergies as of 08/10/2021   No Known Allergies      Medication List     STOP taking these medications  acetaminophen 500 MG tablet Commonly known as: TYLENOL       TAKE these medications    docusate sodium 100 MG capsule Commonly known as: COLACE Take 1 capsule (100 mg total) by mouth 2 (two) times daily.   DULoxetine 60 MG capsule Commonly known as: CYMBALTA Take 60 mg by mouth daily.   enoxaparin 40 MG/0.4ML injection Commonly known as: LOVENOX Inject 0.4 mLs (40 mg total) into the skin daily for 14 days.   gabapentin 300 MG capsule Commonly known as: NEURONTIN Take 900 mg by mouth 2 (two) times daily.   HYDROcodone-acetaminophen 7.5-325 MG tablet Commonly known as: NORCO Take 1-2 tablets by mouth every 4 (four) hours as needed for severe pain (pain score 7-10).   hydroxychloroquine 200 MG tablet Commonly known  as: PLAQUENIL Take 200 mg by mouth 2 (two) times daily.   methocarbamol 500 MG tablet Commonly known as: ROBAXIN Take 1 tablet (500 mg total) by mouth every 6 (six) hours as needed for muscle spasms.   ondansetron 4 MG tablet Commonly known as: ZOFRAN Take 1 tablet (4 mg total) by mouth every 6 (six) hours as needed for nausea.   senna-docusate 8.6-50 MG tablet Commonly known as: Senokot-S Take 1 tablet by mouth at bedtime as needed for mild constipation.   SYLVANT IV Inject 1 Dose into the vein every 21 ( twenty-one) days.   traMADol 50 MG tablet Commonly known as: ULTRAM Take 1 tablet (50 mg total) by mouth every 6 (six) hours as needed.   traZODone 100 MG tablet Commonly known as: DESYREL Take 50-100 mg by mouth at bedtime as needed for sleep.               Durable Medical Equipment  (From admission, onward)           Start     Ordered   08/09/21 1025  DME Walker rolling  Once       Question Answer Comment  Walker: With 5 Inch Wheels   Patient needs a walker to treat with the following condition S/P TKR (total knee replacement) using cement, right      08/09/21 1024   08/09/21 1025  DME 3 n 1  Once        08/09/21 1024   08/09/21 1025  DME Bedside commode  Once       Question:  Patient needs a bedside commode to treat with the following condition  Answer:  S/P TKR (total knee replacement) using cement, right   08/09/21 1024            Diagnostic Studies: DG Knee 1-2 Views Right  Result Date: 08/09/2021 CLINICAL DATA:  Postop knee replacement EXAM: RIGHT KNEE - 1-2 VIEW COMPARISON:  Portable exam 1005 hours without priors for comparison FINDINGS: Components of a RIGHT knee prosthesis are identified. No acute fracture, dislocation, or bone destruction. IMPRESSION: RIGHT knee prosthesis without acute abnormalities. Electronically Signed   By: Lavonia Dana M.D.   On: 08/09/2021 10:18    Disposition:        Signed: Dorise Hiss  CHRISTOPHER 08/10/2021, 12:00 PM

## 2021-08-10 NOTE — Care Management Obs Status (Signed)
[facility]vance NOTIFICATION   Patient Details  Name: Brooke Molina MRN: [id number] Date of Birth: 1976/02/16   Medicare Observation Status Notification Given:  Yes    Conception Oms, RN 08/10/2021, 12:43 PM

## 2021-08-10 NOTE — Progress Notes (Signed)
Pt will have a rolling walker delivered to the bedside by [employer], Set up with [place] for [facility]

## 2021-08-10 NOTE — Progress Notes (Addendum)
Physical Therapy Treatment Patient Details Name: Brooke Molina MRN: [id number] DOB: July 13, 1975 Today's Date: 08/10/2021   History of Present Illness Pt is a 46 y/o F with R knee OA and is s/p elective R TKA. Had L TKA on 09/23/19. PMH includes: Castleman Disease, OA, Anxiety, Obesity.    PT Comments    Pt awake and alert, resting in bed, upon PT entrance into room this morning. She reports 7/10 pain currently at rest. She performs all bed mobility w/ SUPERVISION and is able to perform sit to stand w/ SUPERVISION using RW. Once standing patient was able to ambulate ~144ft w/ SUPERVISION and RW, as well as was able to perform stairs using bilateral railing. Pt was impulsive w/ stairs, but performed them w/ "up w/ good, down w/ bad" due to remembering verbal cueing from prior L TKA on 09/23/19. Pt will benefit from continued skilled PT in order to improve LE strength, mobility, gait, decrease c/o pain, and restore PLOF. Current discharge recommendation remains appropriate due to the level of assistance required by the patient to ensure safety and improve overall function.   Recommendations for follow up therapy are one component of a multi-disciplinary discharge planning process, led by the attending physician.  Recommendations may be updated based on patient status, additional functional criteria and insurance authorization.  Follow Up Recommendations  Home health PT     Assistance Recommended at Discharge Intermittent Supervision/Assistance  Patient can return home with the following A little help with walking and/or transfers;A little help with bathing/dressing/bathroom;Assistance with cooking/housework;Assist for transportation;Help with stairs or ramp for entrance   Equipment Recommendations  Rolling walker (2 wheels)    Recommendations for Other Services       Precautions / Restrictions Precautions Precautions: Fall Restrictions Weight Bearing Restrictions: Yes RLE Weight  Bearing: Weight bearing as tolerated     Mobility  Bed Mobility Overal bed mobility: Needs Assistance Bed Mobility: Supine to Sit     Supine to sit: Supervision          Transfers Overall transfer level: Needs assistance Equipment used: Rolling walker (2 wheels) Transfers: Sit to/from Stand Sit to Stand: Supervision                Ambulation/Gait Ambulation/Gait assistance: Supervision Gait Distance (Feet): 150 Feet Assistive device: Rolling walker (2 wheels) Gait Pattern/deviations: Step-to pattern, Decreased step length - right, Decreased step length - left, Decreased stride length Gait velocity: decreased         Stairs Stairs: Yes Stairs assistance: Supervision Stair Management: Two rails, Step to pattern Number of Stairs: 4     Wheelchair Mobility    Modified Rankin (Stroke Patients Only)       Balance Overall balance assessment: Needs assistance Sitting-balance support: Feet supported, Single extremity supported, No upper extremity supported Sitting balance-Leahy Scale: Normal     Standing balance support: During functional activity, Bilateral upper extremity supported Standing balance-Leahy Scale: Good                              Cognition Arousal/Alertness: Awake/alert Behavior During Therapy: WFL for tasks assessed/performed Overall Cognitive Status: Within Functional Limits for tasks assessed                                          Exercises Total Joint Exercises Heel Slides: AROM, Both,  10 reps Knee Flexion: AROM, Right, 10 reps Goniometric ROM: ~90deg    General Comments        Pertinent Vitals/Pain Pain Assessment Pain Assessment: 0-10 Pain Score: 7  Pain Location: r knee Pain Descriptors / Indicators: Dull, Grimacing, Sore Pain Intervention(s): Limited activity within patient's tolerance, Monitored during session, Premedicated before session, Ice applied    Home Living Family/patient  expects to be discharged to:: Private residence Living Arrangements: Spouse/significant other;Children Available Help at Discharge: Family;Available 24 hours/day Type of Home: House Home Access: Stairs to enter Entrance Stairs-Rails: Right;Left;Can reach both Entrance Stairs-Number of Steps: 3   Home Layout: One level Home Equipment: Cane - single point      Prior Function            PT Goals (current goals can now be found in the care plan section) Progress towards PT goals: Progressing toward goals    Frequency    BID      PT Plan Current plan remains appropriate    Co-evaluation              AM-PAC PT "6 Clicks" Mobility   Outcome Measure  Help needed turning from your back to your side while in a flat bed without using bedrails?: None Help needed moving from lying on your back to sitting on the side of a flat bed without using bedrails?: None Help needed moving to and from a bed to a chair (including a wheelchair)?: None Help needed standing up from a chair using your arms (e.g., wheelchair or bedside chair)?: A Little Help needed to walk in hospital room?: A Little Help needed climbing 3-5 steps with a railing? : None 6 Click Score: 22    End of Session Equipment Utilized During Treatment: Gait belt Activity Tolerance: Patient tolerated treatment well;No increased pain Patient left: in bed;with call bell/phone within reach;with bed alarm set Nurse Communication: Mobility status PT Visit Diagnosis: Unsteadiness on feet (R26.81);Pain;Muscle weakness (generalized) (M62.81);Other abnormalities of gait and mobility (R26.89) Pain - Right/Left: Right Pain - part of body: Knee     Time: 3354-5625 PT Time Calculation (min) (ACUTE ONLY): 19 min  Charges:  $Therapeutic Exercise: 8-22 mins                      Jonnie Kind, SPT 08/10/2021, 11:03 AM

## 2021-08-10 NOTE — Progress Notes (Signed)
° °  Subjective: 1 Day Post-Op Procedure(s) (LRB): TOTAL KNEE ARTHROPLASTY (Right) Patient reports pain as mild.   Patient is well, and has had no acute complaints or problems Denies any CP, SOB, ABD pain. We will continue therapy today.  Plan is to go Home after hospital stay.  Objective: Vital signs in last 24 hours: Temp:  [96.9 F (36.1 C)-99.3 F (37.4 C)] 98.1 F (36.7 C) (02/08 0740) Pulse Rate:  [60-89] 60 (02/08 0740) Resp:  [11-24] 16 (02/08 0740) BP: (107-122)/(67-96) 118/67 (02/08 0740) SpO2:  [92 %-100 %] 96 % (02/08 0740)  Intake/Output from previous day: 02/07 0701 - 02/08 0700 In: 4923.8 [P.O.:2040; I.V.:2683.8; IV Piggyback:200] Out: 4535 [Urine:4525; Blood:10] Intake/Output this shift: No intake/output data recorded.  Recent Labs    08/09/21 1145 08/10/21 0541  HGB 13.3 10.8*   Recent Labs    08/09/21 1145 08/10/21 0541  WBC 11.9* 9.8  RBC 4.23 3.40*  HCT 40.5 31.8*  PLT 265 199   Recent Labs    08/09/21 1145 08/10/21 0541  NA  --  135  K  --  3.8  CL  --  108  CO2  --  24  BUN  --  9  CREATININE 0.67 0.65  GLUCOSE  --  92  CALCIUM  --  8.0*   No results for input(s): LABPT, INR in the last 72 hours.  EXAM General - Patient is Alert, Appropriate, and Oriented Extremity - Neurovascular intact Sensation intact distally Intact pulses distally Dorsiflexion/Plantar flexion intact Dressing - dressing C/D/I and no drainage, Praveena intact with scant drainage Motor Function - intact, moving foot and toes well on exam.   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   Weight as of this encounter: 108.9 kg. Advance diet Up with therapy Pain well controlled Labs are stable Vital signs are stable Patient doing well with no complaints.  Anticipate discharge to home with home health PT today pending completion of PT goals   DVT Prophylaxis - Lovenox, TED hose, and SCDs Weight-Bearing as tolerated to right leg   T. Rachelle Hora, PA-C [place] 08/10/2021, 8:07 AM

## 2021-09-15 ENCOUNTER — Other Ambulatory Visit: Payer: Self-pay | Admitting: Family Medicine

## 2021-09-25 ENCOUNTER — Ambulatory Visit: Payer: BC Managed Care – PPO

## 2021-10-06 ENCOUNTER — Other Ambulatory Visit: Payer: Self-pay | Admitting: Orthopedic Surgery

## 2021-10-06 ENCOUNTER — Ambulatory Visit
Admission: RE | Admit: 2021-10-06 | Discharge: 2021-10-06 | Disposition: A | Discharge status: Home / Self Care | Payer: BC Managed Care – PPO | Source: Ambulatory Visit | Attending: Orthopedic Surgery | Admitting: Orthopedic Surgery

## 2021-10-06 DIAGNOSIS — Z96651 Presence of right artificial knee joint: Secondary | ICD-10-CM | POA: Insufficient documentation

## 2021-10-07 ENCOUNTER — Ambulatory Visit
Admission: RE | Admit: 2021-10-07 | Discharge: 2021-10-07 | Disposition: A | Discharge status: Home / Self Care | Payer: BC Managed Care – PPO | Source: Ambulatory Visit | Attending: Family Medicine | Admitting: Family Medicine

## 2021-10-07 DIAGNOSIS — M545 Low back pain, unspecified: Secondary | ICD-10-CM | POA: Insufficient documentation

## 2021-10-07 DIAGNOSIS — G8929 Other chronic pain: Secondary | ICD-10-CM | POA: Diagnosis present

## 2022-03-13 ENCOUNTER — Ambulatory Visit: Payer: BC Managed Care – PPO | Attending: Registered Nurse

## 2022-03-13 DIAGNOSIS — R102 Pelvic and perineal pain: Secondary | ICD-10-CM | POA: Insufficient documentation

## 2022-03-13 DIAGNOSIS — M5459 Other low back pain: Secondary | ICD-10-CM | POA: Diagnosis present

## 2022-03-13 DIAGNOSIS — R278 Other lack of coordination: Secondary | ICD-10-CM | POA: Diagnosis present

## 2022-03-13 NOTE — Therapy (Signed)
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[place])    Neuromuscular disorder ([place])    Osteoarthritis    bilateral knees   Past Surgical History:  Procedure Laterality Date   LYMPH GLAND EXCISION Left    neck   LYMPH NODE BIOPSY     chest   TONSILLECTOMY  1980   TOTAL KNEE ARTHROPLASTY Left 09/23/2019   Procedure: LEFT TOTAL KNEE ARTHROPLASTY;  Surgeon: Hessie Knows, MD;  Location: [facility] OR[facility];  Service: Orthopedics;  Laterality: Left;   TOTAL KNEE ARTHROPLASTY Right 08/09/2021   Procedure: TOTAL KNEE ARTHROPLASTY;  Surgeon: Hessie Knows, MD;  Location: [facility];  Service: Orthopedics;  Laterality: Right;   TUBAL LIGATION     TYMPANOSTOMY TUBE PLACEMENT     Patient Active Problem List   Diagnosis Date Noted   S/P TKR (total knee replacement) using cement, right 08/09/2021   Knee joint replacement status, left 09/23/2019   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 Unexplained weight loss? Saddle anesthesia? Unexplained changes in bowel or bladder  habits?   SUBJECTIVE: Patient confirms identification and approves PT to assess pelvic floor and treatment Yes                                                                                                                                                                                           PRECAUTIONS: None  WEIGHT BEARING RESTRICTIONS: No  FALLS:  Has patient fallen in last 6 months? Yes. 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 Pt feels like she has confidence in her bladder but occasionally will have dribbling with coughing/sneezing/sexual activity. Pt is also having a lot of pelvic pain with penetrative sex and annual GYN exams which has been ongoing for many years.    PAIN:  Are you having pain? Yes NPRS scale: 8/10 (worst), 3/10 (current) Pain location: low back pain Pain type: aching and dull Pain description: constant   Aggravating factors: standing long periods of time, sometimes on her off days gets worst  pain Relieving factors: mixture of heat/ice, meds, rest    PATIENT GOALS: Pt will like to be able to improve urinary urgency/leakage, and able to have penetrative sex/annual exams without pain    UROLOGICAL HISTORY Fluid intake: Yes: strawberry lemonade water, diet coke (2 big gulps- 1.5 L)    Pain with urination: No Fully empty bladder: Occasionally Stream: Strong Urgency: Yes Frequency: ~9-10x/day  Nocturia: 3-4x, 6-8 hrs a night  Leakage: Walking to the bathroom, Coughing, Sneezing, and Intercourse Pads: No Amount: 2x/day changing underwear  Bladder control (0-10): 5/10   GASTROINTESTINAL HISTORY Pain  with bowel movement: Yes  Type of bowel movement:Type (Bristol Stool Scale) 6 and 7 and Strain Yes Frequency: 4-5x/day  Fully empty rectum: No Leakage: No   SEXUAL HISTORY/FUNCTION Pain with intercourse: During Penetration and Pain Interrupts Intercourse Ability to have vaginal penetration:  No Able to achieve orgasm?: Yes   OBSTETRICAL HISTORY Vaginal deliveries: G2P3 Tearing: stitches with 1st child, episiotomy with twin birth  C-section deliveries: No Currently pregnant: No  GYNECOLOGICAL HISTORY Hysterectomy: no, tubal ligation Pelvic Organ Prolapse: None Pain with exam: yes Heaviness/pressure: no   OBJECTIVE:    COGNITION: Overall cognitive status: Within functional limits for tasks assessed     POSTURE:  Grossly in seated crossed legs during whole session Lumbar lordosis:   Thoracic kyphosis: Deferred 2/2 time constraints Iliac crest height:  Lumbar lateral shift:  Pelvic obliquity:  Leg length discrepancy:   GAIT: Deferred 2/2 time constraints Distance walked:  Assistive device utilized:  Level of assistance:  Comments:   Trendelenburg:   SENSATION: Deferred 2/2 time constraints Light touch: , L2-S2 dermatomes  Proprioception:    RANGE OF MOTION:  Deferred 2/2 time constraints  (Norm range in degrees)  LEFT  RIGHT   Lumbar forward flexion (65):      Lumbar extension (30):     Lumbar lateral flexion (25):     Thoracic and Lumbar rotation (30 degrees):       Hip Flexion (0-125):      Hip IR (0-45):     Hip ER (0-45):     Hip Adduction:      Hip Abduction (0-40):     Hip extension (0-15):     (*= pain, Blank rows = not tested)   STRENGTH: MMT  Deferred 2/2 time constraints  RLE  LLE   Hip Flexion    Hip Extension    Hip Abduction     Hip Adduction     Hip ER     Hip IR     Knee Extension    Knee Flexion    Dorsiflexion     Plantarflexion (  seated)    (*= pain, Blank rows = not tested)   SPECIAL TESTS: Deferred 2/2 time  constraints Centralization and Peripheralization (SN 92, -LR 0.12):  Slump (SN 83, -LR 0.32):  SLR (SN 92, -LR 0.29): R: Lumbar quadrant (SN 70): R:  FABER (SN 81): FADIR (SN 94):  Hip scour (SN 50):  Thigh Thrust (SN 88, -LR 0.18) : Distraction (OF75):  Compression (SN/SP 69): Stork/March (SP 93):   PHYSICAL PERFORMANCE MEASURES: Deferred 2/2 time constraints  STS:  RLE SLS:  LLE SLS:  6 MWT:  10MWT:  5TSTS:   PALPATION: Deferred 2/2 time constraints Abdominal:  Diastasis:  finger above umbilicus,  fingers at and below umbilicus  Scar mobility: present/mobile perpendicular, parallel Rib flare: present/absent  EXTERNAL PELVIC EXAM: Patient educated on the purpose of the pelvic exam and articulated understanding; patient consented to the exam verbally. Deferred 2/2 time constraints Palpation: Breath coordination: present/absent/inconsistent Voluntary Contraction: present/absent Relaxation: full/delayed/non-relaxing Perineal movement with sustained IAP increase ("bear down"): descent/no change/elevation/excessive descent Perineal movement with rapid IAP increase ("cough"): elevation/no change/descent Pubic symphysis: (0= no contraction, 1= flicker, 2= weak squeeze, 3= fair squeeze with lift, 4= good squeeze and lift against resistance, 5= strong squeeze against strong resistance)   INTERNAL PELVIC EXAM: Patient educated on the purpose of the pelvic exam and articulated understanding; patient consented to the exam verbally. Deferred 2/2 to time constraints Introitus Appears:  Skin integrity:  Scar mobility: Strength (PERF):  Symmetry: Palpation: Prolapse: (0= no contraction, 1= flicker, 2= weak squeeze, 3= fair squeeze with lift, 4= good squeeze and lift against resistance, 5= strong squeeze against strong resistance)    Patient Education:  Patient educated on what to expect during course of physical therapy, POC, and provided with HEP including: toileting posture  handout and bladder irritants handout. Patient verbalized understanding and returned demonstration. Patient will benefit from further education in order to maximize compliance and understanding for long-term therapeutic gains.  Patient Surveys:  FOTO Urinary Problem - 57     ASSESSMENT:  Clinical Impression: Patient is a 46 y.o. who was seen today for physical therapy evaluation and treatment for a chief concern of urinary leakage. Today's evaluation suggest deficits in IAP management, PFM strength, PFM coordination, sleep hygiene, PFM extensibility, pain, and scar mobility, as evidenced by urinary leakage with sneezing/coughing/intercourse, worst LBP 8/10 (NPRS) with current 3/10 described as achy/dull, urinary frequency (~9-10x/day) with urinary urgency, nocturia (3-4x), changing undergarments at least 2x/day, feeling of incomplete bowel/bladder emptying, Type 6 and 7(Bristol Stool Chart) average BM, inability to participate in annual GYN exams and penetrative sex due to pelvic pain, and hx of perineal tearing and episiotomy. Patient's responses on FOTO Urinary Problem (57) indicates moderate limitation/disability/distress. Patient's progress may be limited due to time since onset; however, patient's motivation is advantageous. Pt with basic understanding of PFM function in bowel/bladder habits, sexual function, posture, and the deep core. Patient will benefit from skilled therapeutic intervention to address deficits in IAP management, PFM strength, PFM coordination, sleep hygiene, PFM extensibility, pain, and scar mobility in order to increase PLOF and improve overall QOL.    Objective Impairments: decreased coordination, decreased endurance, decreased strength, increased fascial restrictions, improper body mechanics, postural dysfunction, and pain.   Activity Limitations: carrying, lifting, standing, continence, toileting, and locomotion level  Personal Factors: Age, Behavior pattern,  Past/current experiences, Time since onset of injury/illness/exacerbation, and 1-2 comorbidities: Castleman disease, migraine with aura  are also affecting patient's functional outcome.   Rehab Potential: Good  Clinical Decision  Making: Evolving/moderate complexity  Evaluation Complexity: Moderate   GOALS: Goals reviewed with patient? Yes  SHORT TERM GOALS: Target date: 04/17/2022  Patient will decrease intake of caffeinated beverages and other bladder irritants to less than 1 L in order to manage urinary frequency and bladder irritation for improved overall QOL and participation at home and in the community. Baseline: water with straw lemonade packet, up to 1.5 L of diet coke Goal status: INITIAL    LONG TERM GOALS: Target date: 05/22/2022   Patient will score  >/= 65 on FOTO Urinary Problem and at least a 5 point difference in FOTO Lumbar Spine  in order to demonstrate decreased pain, improved PFM coordination, improved IAP management and overall QOL.  Baseline: 57 Goal status: INITIAL  2.  Patient will report decreased changing of undergarments as indicated by a 24  hour period to demonstrate improved bladder control and allow for increased participation in activities outside of the home. Baseline: 2x/day changing underwear as Pt does not use incontinence pads Goal status: INITIAL  3.  Patient will report confidence in ability to control bladder > 7/10 in order to demonstrate improved function and ability to participate more fully in activities at home and in the community. Baseline: 5/10 Goal status: INITIAL  4.  .Patient will decrease worst pain as reported on NPRS by at least 2 points to demonstrate clinically significant reduction in pain in order to restore/improve function and overall QOL. Baseline: 8/10 LBP, inability to have penetrative sex due to pain/increased pain with GYN exams  Goal status: INITIAL  5.  Patient will report less than 5 incidents of stress urinary  incontinence over the course of 3 weeks while coughing/sneezing/walking to the bathroom/intercourse in order to demonstrate improved PFM coordination, strength, and function for improved overall QOL. Baseline: urinary leakage with all the above Goal status: INITIAL  6. Patient will report a decrease in voiding intervals during the night in order to demonstrate improved control of the bladder, PFM coordination, sleep quality, and overall QOL.   Baseline: 3-4x/night  Goal status: INITIAL    PLAN: PT Frequency: 1x/week  PT Duration: 10 weeks  Planned Interventions: Therapeutic exercises, Therapeutic activity, Neuromuscular re-education, Balance training, Gait training, Patient/Family education, Self Care, Joint mobilization, Spinal mobilization, Cryotherapy, Moist heat, scar mobilization, Taping, and Manual therapy  Plan For Next Session: phy asses, bladder diary?, fill out FOTO LBP    Shelsy Seng, PT, DPT  03/13/2022, 5:50 PM

## 2022-03-20 ENCOUNTER — Ambulatory Visit: Payer: BC Managed Care – PPO

## 2022-03-20 DIAGNOSIS — M5459 Other low back pain: Secondary | ICD-10-CM

## 2022-03-20 DIAGNOSIS — R102 Pelvic and perineal pain: Secondary | ICD-10-CM

## 2022-03-20 DIAGNOSIS — R278 Other lack of coordination: Secondary | ICD-10-CM

## 2022-03-20 NOTE — Therapy (Signed)
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[place])    Neuromuscular disorder ([place])    Osteoarthritis    bilateral knees   Past Surgical History:  Procedure Laterality Date   LYMPH GLAND EXCISION Left    neck   LYMPH NODE BIOPSY     chest   TONSILLECTOMY  1980   TOTAL KNEE ARTHROPLASTY Left 09/23/2019   Procedure: LEFT TOTAL KNEE ARTHROPLASTY;  Surgeon: Hessie Knows, MD;  Location: [facility];  Service: Orthopedics;  Laterality: Left;   TOTAL KNEE ARTHROPLASTY Right 08/09/2021   Procedure: TOTAL KNEE ARTHROPLASTY;  Surgeon: Hessie Knows, MD;  Location: [facility];  Service: Orthopedics;  Laterality: Right;   TUBAL LIGATION     TYMPANOSTOMY TUBE PLACEMENT     Patient Active Problem List   Diagnosis Date Noted   S/P TKR (total knee replacement) using cement, right 08/09/2021   Knee joint replacement status, left 09/23/2019   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/02/2017   Tobacco use 10/02/2017    PCP: Donnamarie Rossetti, PA-C  REFERRING PROVIDER: Brantley Stage, NP   REFERRING DIAG:  N39.41 (ICD-10-CM) - Urge incontinence  M79.18 (ICD-10-CM) - Myalgia, other site   THERAPY DIAG:  Pelvic pain  Other lack of coordination  Other low back pain  Rationale for Evaluation and Treatment: Rehabilitation  ONSET DATE: On and off more than 20 years                                                                                                                                                                                  PRECAUTIONS: None  WEIGHT BEARING RESTRICTIONS: No  FALLS:  Has patient fallen in last 6 months? Yes. 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 Pt feels like she has confidence in her bladder but occasionally will have dribbling with coughing/sneezing/sexual activity. Pt is also having a lot of pelvic pain with penetrative sex and annual GYN exams which has been ongoing for many years.   Pain location: low back pain Pain type: aching and dull Pain description: constant   Aggravating factors: standing long periods of time, sometimes on her off days gets worst  pain Relieving factors: mixture of heat/ice, meds, rest    PATIENT GOALS: Pt will like to be able to improve urinary urgency/leakage, and able to have penetrative sex/annual exams without pain    UROLOGICAL HISTORY Fluid intake: Yes: strawberry lemonade water, diet coke (2 big gulps- 1.5 L)    Pain with urination: No Fully empty bladder: Occasionally Stream: Strong Urgency: Yes Frequency: ~9-10x/day  Nocturia: 3-4x, 6-8 hrs a night  Leakage: Walking to the bathroom, Coughing, Sneezing, and Intercourse Pads: No Amount: 2x/day changing underwear  Bladder control (0-10): 5/10   GASTROINTESTINAL HISTORY Pain with bowel movement: Yes  Type of bowel movement:Type (Bristol Stool Scale) 6 and 7 and Strain Yes Frequency: 4-5x/day  Fully empty rectum: No Leakage: No   SEXUAL HISTORY/FUNCTION Pain with intercourse: During Penetration and Pain Interrupts Intercourse Ability to have vaginal penetration:  No Able to achieve orgasm?: Yes    OBSTETRICAL HISTORY Vaginal deliveries: G2P3 Tearing: stitches with 1st child, episiotomy with twin birth  C-section deliveries: No Currently pregnant: No  GYNECOLOGICAL HISTORY Hysterectomy: no, tubal ligation Pelvic Organ Prolapse: None Pain with exam: yes Heaviness/pressure: no   SUBJECTIVE:  Patient has no changes from initial evaluation   PAIN:  Are you having pain? Yes NPRS scale: 5/10, low back, throbbing/achy   TODAY'S TREATMENT   Neuromuscular Re-education:  Pre-treatment assessment:  OBJECTIVE:    COGNITION: Overall cognitive status: Within functional limits for tasks assessed     POSTURE:   Iliac crest height: L iliac crest    RANGE OF MOTION:    (Norm range in degrees)  LEFT 03/20/22 RIGHT 03/20/22  Lumbar forward flexion (65):  WFL*    Lumbar extension (30): WNL    Lumbar lateral flexion (25):  WFL* WNL  Thoracic and Lumbar rotation (30 degrees):    WNL* WNL*  Hip Flexion (0-125):   WNL WNL  Hip IR (0-45):  WNL WNL  Hip ER (0-45):  WNL WNL  Hip Adduction:      Hip Abduction (0-40):  WNL WNL  Hip extension (0-15):     (*= pain, Blank rows = not tested)   STRENGTH: MMT   RLE 03/20/22 LLE 03/20/22  Hip Flexion 5 5  Hip Extension 5 5  Hip Abduction     Hip Adduction     Hip ER  5 5  Hip IR  5 5  Knee Extension 5 5  Knee Flexion 5 5  Dorsiflexion     Plantarflexion (seated) 5 5  (*= pain, Blank rows = not tested)   SPECIAL TESTS:  Slump (SN 83, -LR 0.32): negative B FABER (SN 81): negative B FADIR (SN 94): negative B   PALPATION:  Abdominal:  Diastasis: none Rib flare: present B  -L middle quadrant and upper tender and increased pain  -R middle quadrant towards ribs also tender  EXTERNAL PELVIC EXAM: Patient educated on the purpose of the pelvic exam and articulated understanding; patient consented to the exam verbally. Deferred 2/2 time constraints Palpation: Breath coordination:  present/absent/inconsistent Voluntary Contraction:  present/absent Relaxation: full/delayed/non-relaxing Perineal movement with sustained IAP increase ("bear down"): descent/no change/elevation/excessive descent Perineal movement with rapid IAP increase ("cough"): elevation/no change/descent Pubic symphysis: (0= no contraction, 1= flicker, 2= weak squeeze, 3= fair squeeze with lift, 4= good squeeze and lift against resistance, 5= strong squeeze against strong resistance)   Manual Therapy: Abdominal myofascial release for improved fascial sling mobility and pain modulation  R and L across the middle of the abdomen with increased tenderness  Increased pain on the L side near lower intercostals    Neuromuscular Re-education: Supine hooklying diaphragmatic breathing with VCs and TCs for downregulation of the nervous system and improved management of IAP   Right sidelying  thoracic rotations, x10, for improved lengthening of the anterior fascial slings, VCs and TCs required to decrease bodily compensations (increase rotation of shoulder)   Patient response to interventions: Pt did not feel as tense in the abdomen at end of session   Patient Education:  Patient provided with HEP including: supine diaphragmatic breathing and thoracic rotation. Patient educated throughout session on appropriate technique and form using multi-modal cueing, HEP, and activity modification. Patient will benefit from further education in order to maximize compliance and understanding for long-term therapeutic gains.    ASSESSMENT:  Clinical Impression: Patient presents to clinic with excellent motivation to participate in today's session. Upon physical examination, Pt demonstrates deficits in IAP management, PFM strength, PFM coordination, sleep hygiene, PFM extensibility, pain, and scar mobility, as evidenced by increased L iliac crest height, pain with lumbar forward flexion/L lateral flexion/B thoracic rotation  that radiates towards the lumbar spine, current 5/10 LBP described as achy/throbbing, B rib flare, increased tenderness upon palpation of the abdomen particularly in the middle at B quadrants, urinary leakage with sneezing/coughing/intercourse, and urinary frequency (~9-10x/day) with urinary urgency, nocturia (3-4x). PFM external examine will be performed next session. Pt required moderate VCs and TCs during active interventions for proper technique and to decrease bodily compensations. Pt responded well to active and educational interventions. Patient will benefit from skilled therapeutic intervention to address deficits in IAP management, PFM strength, PFM coordination, sleep hygiene, PFM extensibility, pain, and scar mobility in order to increase PLOF and improve overall QOL.    Objective Impairments: decreased coordination, decreased endurance, decreased strength, increased fascial restrictions, improper body mechanics, postural dysfunction, and pain.   Activity Limitations: carrying, lifting, standing, continence, toileting, and locomotion level  Personal Factors: Age, Behavior pattern, Past/current experiences, Time since onset of injury/illness/exacerbation, and 1-2 comorbidities: Castleman disease, migraine with aura  are also affecting patient's functional outcome.   Rehab Potential: Good  Clinical Decision Making: Evolving/moderate complexity  Evaluation Complexity: Moderate   GOALS: Goals reviewed with patient? Yes  SHORT TERM GOALS: Target date: 04/17/2022  Patient will decrease intake of caffeinated beverages and other bladder irritants to less than 1 L in order to manage urinary frequency and bladder irritation for improved overall QOL and participation at home and in the community. Baseline: water with straw lemonade packet, up to 1.5 L of diet coke Goal status: INITIAL    LONG TERM GOALS: Target date: 05/22/2022   Patient will score  >/= 65 on FOTO Urinary Problem and at  least a 5 point difference in FOTO Lumbar Spine  in order to demonstrate decreased pain, improved PFM coordination, improved IAP management and overall QOL.  Baseline: 57 Goal status: INITIAL  2.  Patient will report decreased changing of undergarments as indicated by a 24  hour period to demonstrate improved bladder control and  allow for increased participation in activities outside of the home. Baseline: 2x/day changing underwear as Pt does not use incontinence pads Goal status: INITIAL  3.  Patient will report confidence in ability to control bladder > 7/10 in order to demonstrate improved function and ability to participate more fully in activities at home and in the community. Baseline: 5/10 Goal status: INITIAL  4.  .Patient will decrease worst pain as reported on NPRS by at least 2 points to demonstrate clinically significant reduction in pain in order to restore/improve function and overall QOL. Baseline: 8/10 LBP, inability to have penetrative sex due to pain/increased pain with GYN exams  Goal status: INITIAL  5.  Patient will report less than 5 incidents of stress urinary incontinence over the course of 3 weeks while coughing/sneezing/walking to the bathroom/intercourse in order to demonstrate improved PFM coordination, strength, and function for improved overall QOL. Baseline: urinary leakage with all the above Goal status: INITIAL  6. Patient will report a decrease in voiding intervals during the night in order to demonstrate improved control of the bladder, PFM coordination, sleep quality, and overall QOL.   Baseline: 3-4x/night  Goal status: INITIAL    PLAN: PT Frequency: 1x/week  PT Duration: 10 weeks  Planned Interventions: Therapeutic exercises, Therapeutic activity, Neuromuscular re-education, Balance training, Gait training, Patient/Family education, Self Care, Joint mobilization, Spinal mobilization, Cryotherapy, Moist heat, scar mobilization, Taping, and Manual  therapy  Plan For Next Session: fill out FOTO LBP, PFM external, low back pain modulation, how was breathing   [employer], PT, DPT  03/20/2022, 4:13 PM

## 2022-03-27 ENCOUNTER — Ambulatory Visit: Payer: BC Managed Care – PPO

## 2022-03-27 DIAGNOSIS — R102 Pelvic and perineal pain: Secondary | ICD-10-CM

## 2022-03-27 DIAGNOSIS — M5459 Other low back pain: Secondary | ICD-10-CM

## 2022-03-27 DIAGNOSIS — R278 Other lack of coordination: Secondary | ICD-10-CM

## 2022-03-27 NOTE — Therapy (Signed)
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[place])    Neuromuscular disorder ([place])    Osteoarthritis    bilateral knees   Past Surgical History:  Procedure Laterality Date   LYMPH GLAND EXCISION Left    neck   LYMPH NODE BIOPSY     chest   TONSILLECTOMY  1980   TOTAL KNEE ARTHROPLASTY Left 09/23/2019   Procedure: LEFT TOTAL KNEE ARTHROPLASTY;  Surgeon: Hessie Knows, MD;  Location: [facility];  Service: Orthopedics;  Laterality: Left;   TOTAL KNEE ARTHROPLASTY Right 08/09/2021   Procedure: TOTAL KNEE ARTHROPLASTY;  Surgeon: Hessie Knows, MD;  Location: [facility];  Service: Orthopedics;  Laterality: Right;   TUBAL LIGATION     TYMPANOSTOMY TUBE PLACEMENT     Patient Active Problem List   Diagnosis Date Noted   S/P TKR (total knee replacement) using cement, right 08/09/2021   Knee joint replacement status, left 09/23/2019   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[place]) 10/02/2017   Tobacco use 10/02/2017    PCP: Donnamarie Rossetti, PA-C  REFERRING PROVIDER: Brantley Stage, NP   REFERRING DIAG:  N39.41 (ICD-10-CM) - Urge incontinence  M79.18 (ICD-10-CM) - Myalgia, other site   THERAPY DIAG:  Pelvic pain  Other lack of coordination  Other low back pain  Rationale for Evaluation and Treatment: Rehabilitation  ONSET DATE: On and off more than 20 years                                                                                                                                                                                  PRECAUTIONS: None  WEIGHT BEARING RESTRICTIONS: No  FALLS:  Has patient fallen in last 6 months? Yes. 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 Pt feels like she has confidence in her bladder but occasionally will have dribbling with coughing/sneezing/sexual activity. Pt is also having a lot of pelvic pain with penetrative sex and annual GYN exams which has been ongoing for many years.   Pain location: low back pain Pain type: aching and dull Pain description: constant   Aggravating factors: standing long periods of time, sometimes on her off days gets worst  pain Relieving factors: mixture of heat/ice, meds, rest    PATIENT GOALS: Pt will like to be able to improve urinary urgency/leakage, and able to have penetrative sex/annual exams without pain    UROLOGICAL HISTORY Fluid intake: Yes: strawberry lemonade water, diet coke (2 big gulps- 1.5 L)    Pain with urination: No Fully empty bladder: Occasionally Stream: Strong Urgency: Yes Frequency: ~9-10x/day  Nocturia: 3-4x, 6-8 hrs a night  Leakage: Walking to the bathroom, Coughing, Sneezing, and Intercourse Pads: No Amount: 2x/day changing underwear  Bladder control (0-10): 5/10   GASTROINTESTINAL HISTORY Pain with bowel movement: Yes  Type of bowel movement:Type (Bristol Stool Scale) 6 and 7 and Strain Yes Frequency: 4-5x/day  Fully empty rectum: No Leakage: No   SEXUAL HISTORY/FUNCTION Pain with intercourse: During Penetration and Pain Interrupts Intercourse Ability to have vaginal penetration:  No Able to achieve orgasm?: Yes    OBSTETRICAL HISTORY Vaginal deliveries: G2P3 Tearing: stitches with 1st child, episiotomy with twin birth  C-section deliveries: No Currently pregnant: No  GYNECOLOGICAL HISTORY Hysterectomy: no, tubal ligation Pelvic Organ Prolapse: None Pain with exam: yes Heaviness/pressure: no   SUBJECTIVE:  Patient has realized she has to concentrate with diaphragmatic breathing.    PAIN:  Are you having pain? Yes NPRS scale: 3/10, low back, throbbing/achy    OBJECTIVE:    COGNITION: Overall cognitive status: Within functional limits for tasks assessed     POSTURE:   Iliac crest height: L iliac crest    RANGE OF MOTION:    (Norm range in degrees)  LEFT 03/20/22 RIGHT 03/20/22  Lumbar forward flexion (65):  WFL*    Lumbar extension (30): WNL    Lumbar lateral flexion (25):  WFL* WNL  Thoracic and Lumbar rotation (30 degrees):    WNL* WNL*  Hip Flexion (0-125):   WNL WNL  Hip IR (0-45):  WNL WNL  Hip ER (0-45):  WNL WNL  Hip Adduction:      Hip Abduction (0-40):  WNL WNL  Hip extension (0-15):     (*= pain, Blank rows = not tested)   STRENGTH: MMT   RLE 03/20/22 LLE 03/20/22  Hip Flexion 5 5  Hip Extension 5 5  Hip Abduction     Hip Adduction     Hip ER  5 5  Hip IR  5 5  Knee Extension 5 5  Knee Flexion 5 5  Dorsiflexion     Plantarflexion (seated) 5 5  (*= pain, Blank rows = not tested)   SPECIAL TESTS:  Slump (SN 83, -LR 0.32): negative B FABER (SN 81): negative B FADIR (SN 94): negative B   PALPATION:  Abdominal:  Diastasis: none Rib flare: present B  -L middle quadrant and upper tender and increased pain  -R middle quadrant towards ribs also tender   TODAY'S TREATMENT  Neuromuscular Re-education:  EXTERNAL PELVIC EXAM: Patient educated on the purpose of the pelvic exam and articulated understanding; patient consented to the exam verbally. Breath coordination: present but inconsistent Voluntary Contraction: present, 4/5  MMT Relaxation:  delayed  Perineal movement with sustained IAP increase ("bear down"): descent Perineal movement with rapid IAP increase ("cough"): no change  (0= no contraction, 1= flicker, 2= weak squeeze, 3= fair squeeze with lift, 4= good squeeze and lift against resistance, 5= strong squeeze against strong resistance)   Supine hooklying diaphragmatic breathing with VCs and TCs for downregulation of the nervous system and improved management of IAP  Supine hooklying PFM lengthening techniques with diaphragmatic breathing and for pain modulation, VCs and TCs as needed              B Single knee to chest   Double knee to chest              "Happy baby" pose   Butterfly pose "adductor stretch"   Piriformis pose    Patient response to interventions: Pt felt more relief in the lower back with above pain modulators, 2/10 at end of session   Patient Education:  Patient provided with HEP including: PFM lengthening techniques and pain modulators for low back. Patient educated throughout session on appropriate technique and form using multi-modal cueing, HEP, and activity modification. Patient will benefit from further education in order to maximize compliance and understanding for long-term therapeutic gains.    ASSESSMENT:  Clinical Impression: Patient presents to clinic with excellent motivation to participate in today's session. Pt continues to demonstrate deficits in IAP management, PFM strength, PFM coordination, sleep hygiene, PFM extensibility, pain, and scar mobility. Pt with 3/10 lower back pain but does report a decrease in urinary incontinence past week. Upon PFM external exam, Pt demonstrates deficits in PFM coordination and PFM extensibility as evidenced by present but inconsistent breath coordination and delayed PFM relaxation. Pt required moderate VCs and TCs during modifications of PFM lengthening techniques due to increased pain a B knees from previous knee replacements. Pt responded well to  active and educational interventions. Patient will benefit from skilled therapeutic intervention to address deficits in IAP management, PFM extensibility, PFM coordination, sleep hygiene, pain, and scar mobility in order to increase PLOF and improve overall QOL.    Objective Impairments: decreased coordination, decreased endurance, decreased strength, increased fascial restrictions, improper body mechanics, postural dysfunction, and pain.   Activity Limitations: carrying, lifting, standing, continence, toileting, and locomotion level  Personal Factors: Age, Behavior pattern, Past/current experiences, Time since onset of injury/illness/exacerbation, and 1-2 comorbidities: Castleman disease, migraine with aura  are also affecting patient's functional outcome.   Rehab Potential: Good  Clinical Decision Making: Evolving/moderate complexity  Evaluation Complexity: Moderate   GOALS: Goals reviewed with patient? Yes  SHORT TERM GOALS: Target date: 04/17/2022  Patient will decrease intake of caffeinated beverages and other bladder irritants to less than 1 L in order to manage urinary frequency and bladder irritation for improved overall QOL and participation at home and in the community. Baseline: water with straw lemonade packet, up to 1.5 L of diet coke Goal status: INITIAL    LONG TERM GOALS: Target date: 05/22/2022   Patient will score  >/= 65 on FOTO Urinary Problem and at least a 5 point difference in FOTO Lumbar Spine  in order to demonstrate decreased pain, improved PFM coordination, improved IAP management and overall QOL.  Baseline: 57 Goal status: INITIAL  2.  Patient will report decreased changing of undergarments as indicated by a 24  hour period to demonstrate improved bladder control and allow for increased participation in activities outside of the home. Baseline: 2x/day changing underwear as Pt does not  use incontinence pads Goal status: INITIAL  3.  Patient will report  confidence in ability to control bladder > 7/10 in order to demonstrate improved function and ability to participate more fully in activities at home and in the community. Baseline: 5/10 Goal status: INITIAL  4.  .Patient will decrease worst pain as reported on NPRS by at least 2 points to demonstrate clinically significant reduction in pain in order to restore/improve function and overall QOL. Baseline: 8/10 LBP, inability to have penetrative sex due to pain/increased pain with GYN exams  Goal status: INITIAL  5.  Patient will report less than 5 incidents of stress urinary incontinence over the course of 3 weeks while coughing/sneezing/walking to the bathroom/intercourse in order to demonstrate improved PFM coordination, strength, and function for improved overall QOL. Baseline: urinary leakage with all the above Goal status: INITIAL  6. Patient will report a decrease in voiding intervals during the night in order to demonstrate improved control of the bladder, PFM coordination, sleep quality, and overall QOL.   Baseline: 3-4x/night  Goal status: INITIAL    PLAN: PT Frequency: 1x/week  PT Duration: 10 weeks  Planned Interventions: Therapeutic exercises, Therapeutic activity, Neuromuscular re-education, Balance training, Gait training, Patient/Family education, Self Care, Joint mobilization, Spinal mobilization, Cryotherapy, Moist heat, scar mobilization, Taping, and Manual therapy  Plan For Next Session: fill out FOTO LBP, PFM lengthening, abdominal work, deep core start?   [employer], PT, DPT  03/27/2022, 4:27 PM

## 2022-04-03 ENCOUNTER — Ambulatory Visit: Payer: BC Managed Care – PPO | Attending: Registered Nurse

## 2022-04-03 DIAGNOSIS — R278 Other lack of coordination: Secondary | ICD-10-CM | POA: Diagnosis present

## 2022-04-03 DIAGNOSIS — M5459 Other low back pain: Secondary | ICD-10-CM | POA: Diagnosis present

## 2022-04-03 DIAGNOSIS — R102 Pelvic and perineal pain: Secondary | ICD-10-CM | POA: Insufficient documentation

## 2022-04-03 NOTE — Therapy (Signed)
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[place])    Neuromuscular disorder ([place])    Osteoarthritis    bilateral knees   Past Surgical History:  Procedure Laterality Date   LYMPH GLAND EXCISION Left    neck   LYMPH NODE BIOPSY     chest   TONSILLECTOMY  1980   TOTAL KNEE ARTHROPLASTY Left 09/23/2019   Procedure: LEFT TOTAL KNEE ARTHROPLASTY;  Surgeon: Hessie Knows, MD;  Location: [facility];  Service: Orthopedics;  Laterality: Left;   TOTAL KNEE ARTHROPLASTY Right 08/09/2021   Procedure: TOTAL KNEE ARTHROPLASTY;  Surgeon: Hessie Knows, MD;  Location: [facility];  Service: Orthopedics;  Laterality: Right;   TUBAL LIGATION     TYMPANOSTOMY TUBE PLACEMENT     Patient Active Problem List   Diagnosis Date Noted   S/P TKR (total knee replacement) using cement, right 08/09/2021   Knee joint replacement status, left 09/23/2019   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[place]) 10/02/2017   Tobacco use 10/02/2017    PCP: Donnamarie Rossetti, PA-C  REFERRING PROVIDER: Brantley Stage, NP   REFERRING DIAG:  N39.41 (ICD-10-CM) - Urge incontinence  M79.18 (ICD-10-CM) - Myalgia, other site   THERAPY DIAG:  Pelvic pain  Other lack of coordination  Other low back pain  Rationale for Evaluation and Treatment: Rehabilitation  ONSET DATE: On and off more than 20 years                                                                                                                                                                                  PRECAUTIONS: None  WEIGHT BEARING RESTRICTIONS: No  FALLS:  Has patient fallen in last 6 months? Yes. 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 Pt feels like she has confidence in her bladder but occasionally will have dribbling with coughing/sneezing/sexual activity. Pt is also having a lot of pelvic pain with penetrative sex and annual GYN exams which has been ongoing for many years.   Pain location: low back pain Pain type: aching and dull Pain description: constant   Aggravating factors: standing long periods of time, sometimes on her off days gets worst  pain Relieving factors: mixture of heat/ice, meds, rest    PATIENT GOALS: Pt will like to be able to improve urinary urgency/leakage, and able to have penetrative sex/annual exams without pain    UROLOGICAL HISTORY Fluid intake: Yes: strawberry lemonade water, diet coke (2 big gulps- 1.5 L)    Pain with urination: No Fully empty bladder: Occasionally Stream: Strong Urgency: Yes Frequency: ~9-10x/day  Nocturia: 3-4x, 6-8 hrs a night  Leakage: Walking to the bathroom, Coughing, Sneezing, and Intercourse Pads: No Amount: 2x/day changing underwear  Bladder control (0-10): 5/10   GASTROINTESTINAL HISTORY Pain with bowel movement: Yes  Type of bowel movement:Type (Bristol Stool Scale) 6 and 7 and Strain Yes Frequency: 4-5x/day  Fully empty rectum: No Leakage: No   SEXUAL HISTORY/FUNCTION Pain with intercourse: During Penetration and Pain Interrupts Intercourse Ability to have vaginal penetration:  No Able to achieve orgasm?: Yes    OBSTETRICAL HISTORY Vaginal deliveries: G2P3 Tearing: stitches with 1st child, episiotomy with twin birth  C-section deliveries: No Currently pregnant: No  GYNECOLOGICAL HISTORY Hysterectomy: no, tubal ligation Pelvic Organ Prolapse: None Pain with exam: yes Heaviness/pressure: no   SUBJECTIVE:  Patient has been doing well. Has a little bit of lower back pain.    PAIN:  Are you having pain? Yes NPRS scale: 4/10, low back, throbbing/achy    OBJECTIVE:    COGNITION: Overall cognitive status: Within functional limits for tasks assessed     POSTURE:   Iliac crest height: L iliac crest    RANGE OF MOTION:    (Norm range in degrees)  LEFT 03/20/22 RIGHT 03/20/22  Lumbar forward flexion (65):  WFL*    Lumbar extension (30): WNL    Lumbar lateral flexion (25):  WFL* WNL  Thoracic and Lumbar rotation (30 degrees):    WNL* WNL*  Hip Flexion (0-125):   WNL WNL  Hip IR (0-45):  WNL WNL  Hip ER (0-45):  WNL WNL  Hip Adduction:      Hip Abduction (0-40):  WNL WNL  Hip extension (0-15):     (*= pain, Blank rows = not tested)   STRENGTH: MMT   RLE 03/20/22 LLE 03/20/22  Hip Flexion 5 5  Hip Extension 5 5  Hip Abduction     Hip Adduction     Hip ER  5 5  Hip IR  5 5  Knee Extension 5 5  Knee Flexion 5 5  Dorsiflexion     Plantarflexion (seated) 5 5  (*= pain, Blank rows = not tested)   SPECIAL TESTS:  Slump (SN 83, -LR 0.32): negative B FABER (SN 81): negative B FADIR (SN 94): negative B   PALPATION:  Abdominal:  Diastasis: none Rib flare: present B  -L middle quadrant and upper tender and increased pain  -R middle quadrant towards ribs also tender  EXTERNAL PELVIC EXAM: Patient educated on the purpose of the pelvic exam and articulated understanding; patient consented to the exam verbally. Breath coordination: present but inconsistent Voluntary Contraction: present, 4/5 MMT Relaxation: delayed  Perineal movement with sustained IAP increase  ("bear down"): descent Perineal movement with rapid IAP increase ("cough"): no change  (0= no contraction, 1= flicker, 2= weak squeeze, 3= fair squeeze with lift, 4= good squeeze and lift against resistance, 5= strong squeeze against strong resistance)   TODAY'S TREATMENT  Neuromuscular Re-education: Time taken to fill out FOTO LBP: 04/03/22 - 56 - Goal: 61  Discussion on using myofascial release as part of HEP and as well as beginning to incorporate with partner as a downregulation of nervous system.   Supine hooklying diaphragmatic breathing with VCs and TCs for downregulation of the nervous system and improved management of IAP  [facility]   Supine hooklying TrA contraction with coordinated exhale, cueing required to minimize PFM contraction   Supine bent knee fallouts with coordinated breath for improved IAP management, VCs and TCs required  Discussion on proper body mechanics while lifting and moving at work. Using breath to guide movement. Pt verbalized understanding and will practice in future sessions.    Patient response to interventions: Pt responded well to "pulling the imaginary string at the back"   Patient Education:  Patient provided with HEP including: supine TrA activation and supine bent knee fallouts. Patient educated throughout session on appropriate technique and form using multi-modal cueing, HEP, and activity modification. Patient will benefit from further education in order to maximize compliance and understanding for long-term therapeutic gains.    ASSESSMENT:  Clinical Impression: Patient presents to clinic with excellent motivation to participate in today's session. Pt continues to demonstrate deficits in IAP management, PFM strength, PFM coordination, sleep hygiene, PFM extensibility, pain, and scar mobility. Pt with 4/10 lower back pain and reports as a dull ache. Pt has continued to perform HEP and reports the movements help. FOTO Lumbar  Spine (56) indicates a moderate limitation/disability/distress. Pt required moderate VCs and TCs during TrA activation in supine to decrease bodily compensations (increase activation of PFM and superficial abdominal musculature). After increased time, Pt able to demonstrates understanding. Pt responded well to active and educational interventions. Patient will benefit from skilled therapeutic intervention to address deficits in IAP management, PFM extensibility, PFM coordination, sleep hygiene, pain, and scar mobility in order to increase PLOF and improve overall QOL.    Objective Impairments: decreased coordination, decreased endurance, decreased strength, increased fascial restrictions, improper body mechanics, postural dysfunction, and pain.   Activity Limitations: carrying, lifting, standing, continence, toileting, and locomotion level  Personal Factors: Age, Behavior pattern, Past/current experiences, Time since onset of injury/illness/exacerbation, and 1-2 comorbidities: Castleman disease, migraine with aura  are also affecting patient's functional outcome.   Rehab Potential: Good  Clinical Decision Making: Evolving/moderate complexity  Evaluation Complexity: Moderate   GOALS: Goals reviewed with patient? Yes  SHORT TERM GOALS: Target date: 04/17/2022  Patient will decrease intake of caffeinated beverages and other bladder irritants to less than 1 L in order to manage urinary frequency and bladder irritation for improved overall QOL and participation at home and in the community. Baseline: water with straw lemonade packet, up to 1.5 L of diet coke Goal status: INITIAL    LONG TERM GOALS: Target date: 05/22/2022   Patient will score  >/= 65 on FOTO Urinary Problem and at least a 5 point difference in FOTO Lumbar Spine  in order to demonstrate decreased pain, improved PFM coordination, improved IAP management and overall QOL.  Baseline: 57 Goal status: INITIAL  2.  Patient will  report decreased changing of undergarments as indicated by a 24  hour period to demonstrate improved bladder control and allow for increased participation in activities outside of the home. Baseline: 2x/day changing underwear as Pt does not use incontinence pads Goal status: INITIAL  3.  Patient will report confidence in ability to control bladder > 7/10 in order to demonstrate improved function and ability to participate more fully in activities at home and in the community. Baseline: 5/10 Goal status: INITIAL  4.  .Patient will decrease worst pain as reported on NPRS by at least 2 points to demonstrate clinically significant reduction in pain in order to restore/improve function and overall QOL. Baseline: 8/10 LBP, inability to have penetrative sex due to pain/increased pain with GYN exams  Goal status: INITIAL  5.  Patient will report less than 5 incidents of stress urinary incontinence over the course of 3 weeks while coughing/sneezing/walking to the bathroom/intercourse in order to demonstrate improved PFM coordination, strength, and function for improved overall QOL. Baseline: urinary leakage with all the above Goal status: INITIAL  6. Patient will report a decrease in voiding intervals during the night in order to demonstrate improved control of the bladder, PFM coordination, sleep quality, and overall QOL.   Baseline: 3-4x/night  Goal status: INITIAL    PLAN: PT Frequency: 1x/week  PT Duration: 10 weeks  Planned Interventions: Therapeutic exercises, Therapeutic activity, Neuromuscular re-education, Balance training, Gait training, Patient/Family education, Self Care, Joint mobilization, Spinal mobilization, Cryotherapy, Moist heat, scar mobilization, Taping, and Manual therapy  Plan For Next Session: how did the deep core go? Lifting/body mechanics, continue deep core, check hips    Elowyn Raupp, PT, DPT  04/03/2022, 3:58 PM

## 2022-04-10 ENCOUNTER — Ambulatory Visit: Payer: BC Managed Care – PPO

## 2022-04-17 ENCOUNTER — Ambulatory Visit: Payer: BC Managed Care – PPO

## 2022-04-17 DIAGNOSIS — M5459 Other low back pain: Secondary | ICD-10-CM

## 2022-04-17 DIAGNOSIS — R102 Pelvic and perineal pain: Secondary | ICD-10-CM

## 2022-04-17 DIAGNOSIS — R278 Other lack of coordination: Secondary | ICD-10-CM

## 2022-04-17 NOTE — Therapy (Signed)
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[place])    Neuromuscular disorder ([place])    Osteoarthritis    bilateral knees   Past Surgical History:  Procedure Laterality Date   LYMPH GLAND EXCISION Left    neck   LYMPH NODE BIOPSY     chest   TONSILLECTOMY  1980   TOTAL KNEE ARTHROPLASTY Left 09/23/2019   Procedure: LEFT TOTAL KNEE ARTHROPLASTY;  Surgeon: Hessie Knows, MD;  Location: [facility];  Service: Orthopedics;  Laterality: Left;   TOTAL KNEE ARTHROPLASTY Right 08/09/2021   Procedure: TOTAL KNEE ARTHROPLASTY;  Surgeon: Hessie Knows, MD;  Location: [facility];  Service: Orthopedics;  Laterality: Right;   TUBAL LIGATION     TYMPANOSTOMY TUBE PLACEMENT     Patient Active Problem List   Diagnosis Date Noted   S/P TKR (total knee replacement) using cement, right 08/09/2021   Knee joint replacement status, left 09/23/2019   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[place]) 10/02/2017   Tobacco use 10/02/2017    PCP: Donnamarie Rossetti, PA-C  REFERRING PROVIDER: Brantley Stage, NP   REFERRING DIAG:  N39.41 (ICD-10-CM) - Urge incontinence  M79.18 (ICD-10-CM) - Myalgia, other site   THERAPY DIAG:  Pelvic pain  Other lack of coordination  Other low back pain  Rationale for Evaluation and Treatment: Rehabilitation  ONSET DATE: On and off more than 20 years                                                                                                                                                                                  PRECAUTIONS: None  WEIGHT BEARING RESTRICTIONS: No  FALLS:  Has patient fallen in last 6 months? Yes. 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 Pt feels like she has confidence in her bladder but occasionally will have dribbling with coughing/sneezing/sexual activity. Pt is also having a lot of pelvic pain with penetrative sex and annual GYN exams which has been ongoing for many years.   Pain location: low back pain Pain type: aching and dull Pain description: constant   Aggravating factors: standing long periods of time, sometimes on her off days gets worst  pain Relieving factors: mixture of heat/ice, meds, rest    PATIENT GOALS: Pt will like to be able to improve urinary urgency/leakage, and able to have penetrative sex/annual exams without pain    UROLOGICAL HISTORY Fluid intake: Yes: strawberry lemonade water, diet coke (2 big gulps- 1.5 L)    Pain with urination: No Fully empty bladder: Occasionally Stream: Strong Urgency: Yes Frequency: ~9-10x/day  Nocturia: 3-4x, 6-8 hrs a night  Leakage: Walking to the bathroom, Coughing, Sneezing, and Intercourse Pads: No Amount: 2x/day changing underwear  Bladder control (0-10): 5/10   GASTROINTESTINAL HISTORY Pain with bowel movement: Yes  Type of bowel movement:Type (Bristol Stool Scale) 6 and 7 and Strain Yes Frequency: 4-5x/day  Fully empty rectum: No Leakage: No   SEXUAL HISTORY/FUNCTION Pain with intercourse: During Penetration and Pain Interrupts Intercourse Ability to have vaginal penetration:  No Able to achieve orgasm?: Yes    OBSTETRICAL HISTORY Vaginal deliveries: G2P3 Tearing: stitches with 1st child, episiotomy with twin birth  C-section deliveries: No Currently pregnant: No  GYNECOLOGICAL HISTORY Hysterectomy: no, tubal ligation Pelvic Organ Prolapse: None Pain with exam: yes Heaviness/pressure: no   SUBJECTIVE:  Patient has been too tired after work to do too much of the HEP this past week. Pt has had a lingering sinus infection and went to the doctor and given more medicine to take. Pt's ribs have been hurting.    PAIN:  Are you having pain? Yes NPRS scale: ribs, 7/10     OBJECTIVE:    COGNITION: Overall cognitive status: Within functional limits for tasks assessed     POSTURE:   Iliac crest height: L iliac crest    RANGE OF MOTION:    (Norm range in degrees)  LEFT 03/20/22 RIGHT 03/20/22  Lumbar forward flexion (65):  WFL*    Lumbar extension (30): WNL    Lumbar lateral flexion (25):  WFL* WNL  Thoracic and Lumbar rotation (30 degrees):    WNL* WNL*  Hip Flexion (0-125):   WNL WNL  Hip IR (0-45):  WNL WNL  Hip ER (0-45):  WNL WNL  Hip Adduction:      Hip Abduction (0-40):  WNL WNL  Hip extension (0-15):     (*= pain, Blank rows = not tested)   STRENGTH: MMT   RLE 03/20/22 LLE 03/20/22  Hip Flexion 5 5  Hip Extension 5 5  Hip Abduction     Hip Adduction     Hip ER  5 5  Hip IR  5 5  Knee Extension 5 5  Knee Flexion 5 5  Dorsiflexion     Plantarflexion (seated) 5 5  (*= pain, Blank rows = not tested)   SPECIAL TESTS:  Slump (SN 83, -LR 0.32): negative B FABER (SN 81): negative B FADIR (SN 94): negative B   PALPATION:  Abdominal:  Diastasis: none Rib flare: present B  -L middle quadrant and upper tender and increased pain  -R middle quadrant towards ribs also tender  EXTERNAL PELVIC EXAM: Patient educated on the purpose  of the pelvic exam and articulated understanding; patient consented to the exam verbally. Breath coordination: present but  inconsistent Voluntary Contraction: present, 4/5 MMT Relaxation: delayed  Perineal movement with sustained IAP increase ("bear down"): descent Perineal movement with rapid IAP increase ("cough"): no change  (0= no contraction, 1= flicker, 2= weak squeeze, 3= fair squeeze with lift, 4= good squeeze and lift against resistance, 5= strong squeeze against strong resistance)   TODAY'S TREATMENT  Neuromuscular Re-education: Supine hooklying diaphragmatic breathing with VCs and TCs for downregulation of the nervous system and improved management of IAP  With heat modality at middle of ribs   [facility] abdominal rehab   Supine hooklying TrA w/marches    Supine bent knee fallouts with TrA activation and coordinated breath for improved IAP management, VCs and TCs required  Seated diaphragmatic breathing with VCs and TCs for downregulation of the nervous system and improved management of IAP   Seated TrA activation with coordinate breathing for improved IAP management, VCs and TCs required   Reassessed: POSTURE:   Iliac crest height: iliac crests equal compared to IE  Patient response to interventions: Pt ended session with 5/10 pain at the ribs    Patient Education:  Patient provided with HEP including: supine TrA activation with march and seated TrA activation. Patient educated throughout session on appropriate technique and form using multi-modal cueing, HEP, and activity modification. Patient will benefit from further education in order to maximize compliance and understanding for long-term therapeutic gains.    ASSESSMENT:  Clinical Impression: Patient presents to clinic with excellent motivation to participate in today's session. Pt continues to demonstrate deficits in IAP management, PFM strength, PFM coordination, sleep hygiene, PFM extensibility, pain, and scar mobility. Pt with increased rib pain, 7/10, due to worsening sinus infection. Pt has followed up with PCP and receiving  treatment. Upon brief posture reassessment, Pt with improved posture, equal iliac crests, compared to IE (L iliac crest elevated). Pt required moderate VCs and TCs during various TrA activation challenges for proper technique and to decrease bodily compensations. Pt ended session with 5/10 pain at the ribs and reports heat modality helped. Pt responded well to active and educational interventions.  Patient will benefit from skilled therapeutic intervention to address deficits in IAP management, PFM extensibility, PFM coordination, sleep hygiene, pain, and scar mobility in order to increase PLOF and improve overall QOL.    Objective Impairments: decreased coordination, decreased endurance, decreased strength, increased fascial restrictions, improper body mechanics, postural dysfunction, and pain.   Activity Limitations: carrying, lifting, standing, continence, toileting, and locomotion level  Personal Factors: Age, Behavior pattern, Past/current experiences, Time since onset of injury/illness/exacerbation, and 1-2 comorbidities: Castleman disease, migraine with aura  are also affecting patient's functional outcome.   Rehab Potential: Good  Clinical Decision Making: Evolving/moderate complexity  Evaluation Complexity: Moderate   GOALS: Goals reviewed with patient? Yes  SHORT TERM GOALS: Target date: 04/17/2022  Patient will decrease intake of caffeinated beverages and other bladder irritants to less than 1 L in order to manage urinary frequency and bladder irritation for improved overall QOL and participation at home and in the community. Baseline: water with straw lemonade packet, up to 1.5 L of diet coke Goal status: INITIAL    LONG TERM GOALS: Target date: 05/22/2022   Patient will score  >/= 65 on FOTO Urinary Problem and at least a 5 point difference in FOTO Lumbar Spine  in order to demonstrate decreased pain, improved PFM coordination, improved IAP management and overall QOL.  Baseline: 57 Goal status: INITIAL  2.  Patient will report decreased changing of undergarments as indicated by a 24  hour period to demonstrate improved bladder control and allow for increased participation in activities outside of the home. Baseline: 2x/day changing underwear as Pt does not use incontinence pads Goal status: INITIAL  3.  Patient will report confidence in ability to control bladder > 7/10 in order to demonstrate improved function and ability to participate more fully in activities at home and in the community. Baseline: 5/10 Goal status: INITIAL  4.  .Patient will decrease worst pain as reported on NPRS by at least 2 points to demonstrate clinically significant reduction in pain in order to restore/improve function and overall QOL. Baseline: 8/10 LBP, inability to have penetrative sex due to pain/increased pain with GYN exams  Goal status: INITIAL  5.  Patient will report less than 5 incidents of stress urinary incontinence over the course of 3 weeks while coughing/sneezing/walking to the bathroom/intercourse in order to demonstrate improved PFM coordination, strength, and function for improved overall QOL. Baseline: urinary leakage with all the above Goal status: INITIAL  6. Patient will report a decrease in voiding intervals during the night in order to demonstrate improved control of the bladder, PFM coordination, sleep quality, and overall QOL.   Baseline: 3-4x/night  Goal status: INITIAL    PLAN: PT Frequency: 1x/week  PT Duration: 10 weeks  Planned Interventions: Therapeutic exercises, Therapeutic activity, Neuromuscular re-education, Balance training, Gait training, Patient/Family education, Self Care, Joint mobilization, Spinal mobilization, Cryotherapy, Moist heat, scar mobilization, Taping, and Manual therapy  Plan For Next Session: continue deep core, how did HEP go this week? How are ribs?   Jacqueline Delapena, PT, DPT  04/17/2022, 4:58 PM

## 2022-04-24 ENCOUNTER — Ambulatory Visit: Payer: BC Managed Care – PPO

## 2022-04-24 DIAGNOSIS — R102 Pelvic and perineal pain: Secondary | ICD-10-CM

## 2022-04-24 DIAGNOSIS — R278 Other lack of coordination: Secondary | ICD-10-CM

## 2022-04-24 DIAGNOSIS — M5459 Other low back pain: Secondary | ICD-10-CM

## 2022-04-24 NOTE — Therapy (Signed)
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[place])    Neuromuscular disorder ([place])    Osteoarthritis    bilateral knees   Past Surgical History:  Procedure Laterality Date   LYMPH GLAND EXCISION Left    neck   LYMPH NODE BIOPSY     chest   TONSILLECTOMY  1980   TOTAL KNEE ARTHROPLASTY Left 09/23/2019   Procedure: LEFT TOTAL KNEE ARTHROPLASTY;  Surgeon: Hessie Knows, MD;  Location: [facility];  Service: Orthopedics;  Laterality: Left;   TOTAL KNEE ARTHROPLASTY Right 08/09/2021   Procedure: TOTAL KNEE ARTHROPLASTY;  Surgeon: Hessie Knows, MD;  Location: [facility];  Service: Orthopedics;  Laterality: Right;   TUBAL LIGATION     TYMPANOSTOMY TUBE PLACEMENT     Patient Active Problem List   Diagnosis Date Noted   S/P TKR (total knee replacement) using cement, right 08/09/2021   Knee joint replacement status, left 09/23/2019   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[place]) 10/02/2017   Tobacco use 10/02/2017    PCP: Donnamarie Rossetti, PA-C  REFERRING PROVIDER: Brantley Stage, NP   REFERRING DIAG:  N39.41 (ICD-10-CM) - Urge incontinence  M79.18 (ICD-10-CM) - Myalgia, other site   THERAPY DIAG:  Pelvic pain  Other lack of coordination  Other low back pain  Rationale for Evaluation and Treatment: Rehabilitation  ONSET DATE: On and off more than 20 years                                                                                                                                                                                  PRECAUTIONS: None  WEIGHT BEARING RESTRICTIONS: No  FALLS:  Has patient fallen in last 6 months? Yes. 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 Pt feels like she has confidence in her bladder but occasionally will have dribbling with coughing/sneezing/sexual activity. Pt is also having a lot of pelvic pain with penetrative sex and annual GYN exams which has been ongoing for many years.   Pain location: low back pain Pain type: aching and dull Pain description: constant   Aggravating factors: standing long periods of time, sometimes on her off days gets worst  pain Relieving factors: mixture of heat/ice, meds, rest    PATIENT GOALS: Pt will like to be able to improve urinary urgency/leakage, and able to have penetrative sex/annual exams without pain    UROLOGICAL HISTORY Fluid intake: Yes: strawberry lemonade water, diet coke (2 big gulps- 1.5 L)    Pain with urination: No Fully empty bladder: Occasionally Stream: Strong Urgency: Yes Frequency: ~9-10x/day  Nocturia: 3-4x, 6-8 hrs a night  Leakage: Walking to the bathroom, Coughing, Sneezing, and Intercourse Pads: No Amount: 2x/day changing underwear  Bladder control (0-10): 5/10   GASTROINTESTINAL HISTORY Pain with bowel movement: Yes  Type of bowel movement:Type (Bristol Stool Scale) 6 and 7 and Strain Yes Frequency: 4-5x/day  Fully empty rectum: No Leakage: No   SEXUAL HISTORY/FUNCTION Pain with intercourse: During Penetration and Pain Interrupts Intercourse Ability to have vaginal penetration:  No Able to achieve orgasm?: Yes    OBSTETRICAL HISTORY Vaginal deliveries: G2P3 Tearing: stitches with 1st child, episiotomy with twin birth  C-section deliveries: No Currently pregnant: No  GYNECOLOGICAL HISTORY Hysterectomy: no, tubal ligation Pelvic Organ Prolapse: None Pain with exam: yes Heaviness/pressure: no   SUBJECTIVE:  Patient is still recovering from her really bad upper respiratory infection even with antibiotics and steroid. Pt has follow up appt on Thursday.    PAIN:  Are you having pain? Yes NPRS scale: ribs, 5/10     OBJECTIVE:    COGNITION: Overall cognitive status: Within functional limits for tasks assessed     POSTURE:   Iliac crest height: L iliac crest    RANGE OF MOTION:    (Norm range in degrees)  LEFT 03/20/22 RIGHT 03/20/22  Lumbar forward flexion (65):  WFL*    Lumbar extension (30): WNL    Lumbar lateral flexion (25):  WFL* WNL  Thoracic and Lumbar rotation (30 degrees):    WNL* WNL*  Hip Flexion (0-125):   WNL WNL  Hip IR (0-45):  WNL WNL  Hip ER (0-45):  WNL WNL  Hip Adduction:      Hip Abduction (0-40):  WNL WNL  Hip extension (0-15):     (*= pain, Blank rows = not tested)   STRENGTH: MMT   RLE 03/20/22 LLE 03/20/22  Hip Flexion 5 5  Hip Extension 5 5  Hip Abduction     Hip Adduction     Hip ER  5 5  Hip IR  5 5  Knee Extension 5 5  Knee Flexion 5 5  Dorsiflexion     Plantarflexion (seated) 5 5  (*= pain, Blank rows = not tested)   SPECIAL TESTS:  Slump (SN 83, -LR 0.32): negative B FABER (SN 81): negative B FADIR (SN 94): negative B   PALPATION:  Abdominal:  Diastasis: none Rib flare: present B  -L middle quadrant and upper tender and increased pain  -R middle quadrant towards ribs also tender  EXTERNAL PELVIC EXAM: Patient educated on the purpose of the pelvic exam and articulated understanding; patient consented to the exam verbally. Breath coordination: present but  inconsistent Voluntary Contraction: present, 4/5 MMT Relaxation:  delayed  Perineal movement with sustained IAP increase ("bear down"): descent Perineal movement with rapid IAP increase ("cough"): no change  (0= no contraction, 1= flicker, 2= weak squeeze, 3= fair squeeze with lift, 4= good squeeze and lift against resistance, 5= strong squeeze against strong resistance)   TODAY'S TREATMENT  Neuromuscular Re-education: Supine hooklying diaphragmatic breathing with VCs and TCs for downregulation of the nervous system and improved management of IAP  With heat modality at middle of ribs   Discussion on urinary urgency control and how to use techniques (distractions, breathing, etc.) to control nocturia.  Full body relaxation audio (Nari Clemmons) with diaphragmatic breathing for downregulation of nervous system. Pt given link. Also, discussion on sympathetic and parasympathetic nervous system    Supine bridgeswith coordinated exhale for improved IAP management, VCs and TCs required   Supine hip flexion isometric with coordinated breathing for improved IAP management, VCs and TCs required    Patient response to interventions: Pt with improved rib pain at end of session   Patient Education:  Patient provided with HEP including: supine hip flexion isometric, supine bridging with breathing. Patient educated throughout session on appropriate technique and form using multi-modal cueing, HEP, and activity modification. Patient will benefit from further education in order to maximize compliance and understanding for long-term therapeutic gains.    ASSESSMENT:  Clinical Impression: Patient presents to clinic with excellent motivation to participate in today's session. Pt continues to demonstrate deficits in IAP management, PFM strength, PFM coordination, sleep hygiene, PFM extensibility, pain, and scar mobility. Pt continues to have upper respiratory infection with increased rib pain. Will be following-up with PCP on Thursday. Discussion on the parasympathetic  and sympathetic nervous system and how that can affect PFM coordination at night. Pt verbalized understanding. Pt required moderate VCs and TCs during various TrA activation and LE challenges for proper technique and to decrease bodily compensations. Pt responded well to active and educational interventions. Patient will benefit from skilled therapeutic intervention to address deficits in IAP management, PFM extensibility, PFM coordination, sleep hygiene, pain, and scar mobility in order to increase PLOF and improve overall QOL.    Objective Impairments: decreased coordination, decreased endurance, decreased strength, increased fascial restrictions, improper body mechanics, postural dysfunction, and pain.   Activity Limitations: carrying, lifting, standing, continence, toileting, and locomotion level  Personal Factors: Age, Behavior pattern, Past/current experiences, Time since onset of injury/illness/exacerbation, and 1-2 comorbidities: Castleman disease, migraine with aura  are also affecting patient's functional outcome.   Rehab Potential: Good  Clinical Decision Making: Evolving/moderate complexity  Evaluation Complexity: Moderate   GOALS: Goals reviewed with patient? Yes  SHORT TERM GOALS: Target date: 04/17/2022  Patient will decrease intake of caffeinated beverages and other bladder irritants to less than 1 L in order to manage urinary frequency and bladder irritation for improved overall QOL and participation at home and in the community. Baseline: water with straw lemonade packet, up to 1.5 L of diet coke Goal status: INITIAL    LONG TERM GOALS: Target date: 05/22/2022   Patient will score  >/= 65 on FOTO Urinary Problem and at least a 5 point difference in FOTO Lumbar Spine  in order to demonstrate decreased pain, improved PFM coordination, improved IAP management and overall QOL.  Baseline: 57 Goal status: INITIAL  2.  Patient will report decreased changing of  undergarments as indicated by a 24  hour period to demonstrate improved bladder control and allow for increased participation in  activities outside of the home. Baseline: 2x/day changing underwear as Pt does not use incontinence pads Goal status: INITIAL  3.  Patient will report confidence in ability to control bladder > 7/10 in order to demonstrate improved function and ability to participate more fully in activities at home and in the community. Baseline: 5/10 Goal status: INITIAL  4.  .Patient will decrease worst pain as reported on NPRS by at least 2 points to demonstrate clinically significant reduction in pain in order to restore/improve function and overall QOL. Baseline: 8/10 LBP, inability to have penetrative sex due to pain/increased pain with GYN exams  Goal status: INITIAL  5.  Patient will report less than 5 incidents of stress urinary incontinence over the course of 3 weeks while coughing/sneezing/walking to the bathroom/intercourse in order to demonstrate improved PFM coordination, strength, and function for improved overall QOL. Baseline: urinary leakage with all the above Goal status: INITIAL  6. Patient will report a decrease in voiding intervals during the night in order to demonstrate improved control of the bladder, PFM coordination, sleep quality, and overall QOL.   Baseline: 3-4x/night  Goal status: INITIAL    PLAN: PT Frequency: 1x/week  PT Duration: 10 weeks  Planned Interventions: Therapeutic exercises, Therapeutic activity, Neuromuscular re-education, Balance training, Gait training, Patient/Family education, Self Care, Joint mobilization, Spinal mobilization, Cryotherapy, Moist heat, scar mobilization, Taping, and Manual therapy  Plan For Next Session: continue deep core, how did the past 3 weeks go? Progress in standing    Xsavier Seeley, PT, DPT  04/24/2022, 4:15 PM

## 2022-05-01 ENCOUNTER — Other Ambulatory Visit: Payer: Self-pay | Admitting: Family Medicine

## 2022-05-01 DIAGNOSIS — R0781 Pleurodynia: Secondary | ICD-10-CM

## 2022-05-01 DIAGNOSIS — R0602 Shortness of breath: Secondary | ICD-10-CM

## 2022-05-02 ENCOUNTER — Ambulatory Visit
Admission: RE | Admit: 2022-05-02 | Discharge: 2022-05-02 | Disposition: A | Discharge status: Home / Self Care | Payer: BC Managed Care – PPO | Source: Ambulatory Visit | Attending: Family Medicine | Admitting: Family Medicine

## 2022-05-02 DIAGNOSIS — R0602 Shortness of breath: Secondary | ICD-10-CM | POA: Diagnosis present

## 2022-05-02 DIAGNOSIS — R0781 Pleurodynia: Secondary | ICD-10-CM | POA: Insufficient documentation

## 2022-05-02 MED ORDER — IOHEXOL 350 MG/ML SOLN
75.0000 mL | Freq: Once | INTRAVENOUS | Status: AC | PRN
  Administered 2022-05-02: 75 mL via INTRAVENOUS

## 2022-05-15 ENCOUNTER — Ambulatory Visit: Payer: BC Managed Care – PPO | Attending: Registered Nurse

## 2022-05-15 DIAGNOSIS — M5459 Other low back pain: Secondary | ICD-10-CM

## 2022-05-15 DIAGNOSIS — R102 Pelvic and perineal pain unspecified side: Secondary | ICD-10-CM

## 2022-05-15 DIAGNOSIS — R278 Other lack of coordination: Secondary | ICD-10-CM

## 2022-05-15 NOTE — Therapy (Signed)
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[place])    Neuromuscular disorder ([place])    Osteoarthritis    bilateral knees   Past Surgical History:  Procedure Laterality Date   LYMPH GLAND EXCISION Left    neck   LYMPH NODE BIOPSY     chest   TONSILLECTOMY  1980   TOTAL KNEE ARTHROPLASTY Left 09/23/2019   Procedure: LEFT TOTAL KNEE ARTHROPLASTY;  Surgeon: Hessie Knows, MD;  Location: [facility] ORS;  Service: Orthopedics;  Laterality: Left;   TOTAL KNEE ARTHROPLASTY Right 08/09/2021   Procedure: TOTAL KNEE ARTHROPLASTY;  Surgeon: Hessie Knows, MD;  Location: [facility] ORS;  Service: Orthopedics;  Laterality: Right;   TUBAL LIGATION     TYMPANOSTOMY TUBE PLACEMENT     Patient Active Problem List   Diagnosis Date Noted   S/P TKR (total knee replacement) using cement, right 08/09/2021   Knee joint replacement status, left 09/23/2019   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[place]) 10/02/2017   Tobacco use 10/02/2017    PCP: Donnamarie Rossetti, PA-C  REFERRING PROVIDER: Brantley Stage, NP   REFERRING DIAG:  N39.41 (ICD-10-CM) - Urge incontinence  M79.18 (ICD-10-CM) - Myalgia, other site   THERAPY DIAG:  Pelvic pain  Other lack of coordination  Other low back pain  Rationale for Evaluation and Treatment: Rehabilitation  ONSET DATE: On and off more than 20 years                                                                                                                                                                                  PRECAUTIONS: None  WEIGHT BEARING RESTRICTIONS: No  FALLS:  Has patient fallen in last 6 months? Yes. 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 Pt feels like she has confidence in her bladder but occasionally will have dribbling with coughing/sneezing/sexual activity. Pt is also having a lot of pelvic pain with penetrative sex and annual GYN exams which has been ongoing for many years.   Pain location: low back pain Pain type: aching and dull Pain description: constant   Aggravating factors: standing long periods of time, sometimes on her off days gets worst  pain Relieving factors: mixture of heat/ice, meds, rest    PATIENT GOALS: Pt will like to be able to improve urinary urgency/leakage, and able to have penetrative sex/annual exams without pain    UROLOGICAL HISTORY Fluid intake: Yes: strawberry lemonade water, diet coke (2 big gulps- 1.5 L)    Pain with urination: No Fully empty bladder: Occasionally Stream: Strong Urgency: Yes Frequency: ~9-10x/day  Nocturia: 3-4x, 6-8 hrs a night  Leakage: Walking to the bathroom, Coughing, Sneezing, and Intercourse Pads: No Amount: 2x/day changing underwear  Bladder control (0-10): 5/10   GASTROINTESTINAL HISTORY Pain with bowel movement: Yes  Type of bowel movement:Type (Bristol Stool Scale) 6 and 7 and Strain Yes Frequency: 4-5x/day  Fully empty rectum: No Leakage: No   SEXUAL HISTORY/FUNCTION Pain with intercourse: During Penetration and Pain Interrupts Intercourse Ability to have vaginal penetration:  No Able to achieve orgasm?: Yes    OBSTETRICAL HISTORY Vaginal deliveries: G2P3 Tearing: stitches with 1st child, episiotomy with twin birth  C-section deliveries: No Currently pregnant: No  GYNECOLOGICAL HISTORY Hysterectomy: no, tubal ligation Pelvic Organ Prolapse: None Pain with exam: yes Heaviness/pressure: no   SUBJECTIVE:  Patient continues to be recovering from cold. Pt has had X-rays and CT scan of the chest to rule out PE and pneumonia. All tests came back negative.    PAIN:  Are you having pain? No NPRS scale:     OBJECTIVE:    COGNITION: Overall cognitive status: Within functional limits for tasks assessed     POSTURE:   Iliac crest height: L iliac crest    RANGE OF MOTION:    (Norm range in degrees)  LEFT 03/20/22 RIGHT 03/20/22  Lumbar forward flexion (65):  WFL*    Lumbar extension (30): WNL    Lumbar lateral flexion (25):  WFL* WNL  Thoracic and Lumbar rotation (30 degrees):    WNL* WNL*  Hip Flexion (0-125):   WNL WNL  Hip IR (0-45):  WNL WNL  Hip ER (0-45):  WNL WNL  Hip Adduction:      Hip Abduction (0-40):  WNL WNL  Hip extension (0-15):     (*= pain, Blank rows = not tested)   STRENGTH: MMT   RLE 03/20/22 LLE 03/20/22  Hip Flexion 5 5  Hip Extension 5 5  Hip Abduction     Hip Adduction     Hip ER  5 5  Hip IR  5 5  Knee Extension 5 5  Knee Flexion 5 5  Dorsiflexion     Plantarflexion (seated) 5 5  (*= pain, Blank rows = not tested)   SPECIAL TESTS:  Slump (SN 83, -LR 0.32): negative B FABER (SN 81): negative B FADIR (SN 94): negative B   PALPATION:  Abdominal:  Diastasis: none Rib flare: present B  -L middle quadrant and upper tender and increased pain  -R middle quadrant towards ribs also tender  EXTERNAL PELVIC EXAM: Patient educated on the purpose of the pelvic exam and articulated understanding; patient consented to the exam verbally. Breath  coordination: present but inconsistent Voluntary Contraction: present, 4/5 MMT Relaxation: delayed   Perineal movement with sustained IAP increase ("bear down"): descent Perineal movement with rapid IAP increase ("cough"): no change  (0= no contraction, 1= flicker, 2= weak squeeze, 3= fair squeeze with lift, 4= good squeeze and lift against resistance, 5= strong squeeze against strong resistance)   TODAY'S TREATMENT  Neuromuscular Re-education: FOTO Reassessment -  Urinary Problem - Today: 62 (IE: 57) Lumbar Spine - Today: 74 (IE: 56) Discussion on responses and moderate change from IE   Discussion on bladder irritants and how Pt has noticed more urinary urgency and leakage during the night due to intake of bladder irritants right before bed.   Standing wall squats with coordinated breathing, x10, for improved IAP management, VCs and TCs required  Forearm wall plank, 3x 30 secs, with coordinated breathing for improved IAP management, VCs and TCs required  Discussion on continuing PFPT as Pt is content with significant progression with both lumbar spine and urinary incontinence. Pt will return in 2 weeks and decide.   Patient response to interventions: Pt will return in 2 weeks   Patient Education:  Patient provided with HEP including: standing wall squats, forearm plank, pallof press. Patient educated throughout session on appropriate technique and form using multi-modal cueing, HEP, and activity modification. Patient will benefit from further education in order to maximize compliance and understanding for long-term therapeutic gains.    ASSESSMENT:  Clinical Impression: Patient presents to clinic with excellent motivation to participate in today's session. Pt continues to demonstrate deficits in IAP management, PFM strength, PFM coordination, sleep hygiene, PFM extensibility, pain, and scar mobility. Pt continues to have improved respiratory sxs. Based on FOTO reassessment, Pt has made significant change in Lumbar Spine (74) compared to IE (56). Pt has LBP very minimally now  compared to constantly and worst pain 8/10 at IE. Pt also made moderate improvement in FOTO Urinary Problem (62) compared to IE (57) but continues to have deficits in relation to IAP management, PFM strength, and PFM coordination. Pt required moderate VCs and TCs during various TrA activation in standing for proper technique and to decrease bodily compensations (decrease shoulder elevation). Pt responded well to active and educational interventions. Patient will continue to benefit from skilled therapeutic intervention to address deficits in IAP management, PFM extensibility, PFM coordination, sleep hygiene, pain, and scar mobility in order to increase PLOF and improve overall QOL.    Objective Impairments: decreased coordination, decreased endurance, decreased strength, increased fascial restrictions, improper body mechanics, postural dysfunction, and pain.   Activity Limitations: carrying, lifting, standing, continence, toileting, and locomotion level  Personal Factors: Age, Behavior pattern, Past/current experiences, Time since onset of injury/illness/exacerbation, and 1-2 comorbidities: Castleman disease, migraine with aura  are also affecting patient's functional outcome.   Rehab Potential: Good  Clinical Decision Making: Evolving/moderate complexity  Evaluation Complexity: Moderate   GOALS: Goals reviewed with patient? Yes  SHORT TERM GOALS: Target date: 04/17/2022  Patient will decrease intake of caffeinated beverages and other bladder irritants to less than 1 L in order to manage urinary frequency and bladder irritation for improved overall QOL and participation at home and in the community. Baseline: water with straw lemonade packet, up to 1.5 L of diet coke; (11/13): continues to drink 1.5 L of diet coke and water with strawberry lemonade packet  Goal status: IN PROGRESS    LONG TERM GOALS: Target date: 05/22/2022   Patient will score  >/= 65 on FOTO Urinary Problem and  at  least a 5 point difference in FOTO Lumbar Spine  in order to demonstrate decreased pain, improved PFM coordination, improved IAP management and overall QOL.  Baseline: 57; (11/13): 62, 74 (Lumbar - GOAL MET)  Goal status: IN PROGRESS  2.  Patient will report decreased changing of undergarments as indicated by a 24  hour period to demonstrate improved bladder control and allow for increased participation in activities outside of the home. Baseline: 2x/day changing underwear as Pt does not use incontinence pads Goal status: INITIAL  3.  Patient will report confidence in ability to control bladder > 7/10 in order to demonstrate improved function and ability to participate more fully in activities at home and in the community. Baseline: 5/10 Goal status: INITIAL  4.  .Patient will decrease worst pain as reported on NPRS by at least 2 points to demonstrate clinically significant reduction in pain in order to restore/improve function and overall QOL. Baseline: 8/10 LBP, inability to have penetrative sex due to pain/increased pain with GYN exams  Goal status: INITIAL  5.  Patient will report less than 5 incidents of stress urinary incontinence over the course of 3 weeks while coughing/sneezing/walking to the bathroom/intercourse in order to demonstrate improved PFM coordination, strength, and function for improved overall QOL. Baseline: urinary leakage with all the above Goal status: INITIAL  6. Patient will report a decrease in voiding intervals during the night in order to demonstrate improved control of the bladder, PFM coordination, sleep quality, and overall QOL.   Baseline: 3-4x/night   Goal status: INITIAL    PLAN: PT Frequency: 1x/week  PT Duration: 10 weeks  Planned Interventions: Therapeutic exercises, Therapeutic activity, Neuromuscular re-education, Balance training, Gait training, Patient/Family education, Self Care, Joint mobilization, Spinal mobilization, Cryotherapy, Moist  heat, scar mobilization, Taping, and Manual therapy  Plan For Next Session: how was the past 2 weeks? Did you want to continue, how was bladder irritants? continue deep core (bird dog at wall and supine)    Hosanna Betley, PT, DPT  05/15/2022, 4:47 PM

## 2022-05-22 ENCOUNTER — Ambulatory Visit: Payer: BC Managed Care – PPO

## 2022-05-29 ENCOUNTER — Ambulatory Visit: Payer: BC Managed Care – PPO

## 2022-06-12 ENCOUNTER — Ambulatory Visit: Payer: BC Managed Care – PPO | Attending: Registered Nurse

## 2022-06-12 ENCOUNTER — Telehealth: Payer: Self-pay

## 2022-06-12 NOTE — Telephone Encounter (Signed)
Attempted to call Pt about no-show to today's PFPT. Could not leave voicemail on mobile phone.

## 2022-06-13 NOTE — Therapy (Signed)
[place] [facility]IN [facility] [address] [place], [place], [address] Phone: [phone]   Fax:  [phone]  June 13, 2022   No Recipients  Physical Therapy Discharge Summary  Patient: Brooke Molina  MRN: [id number]  Date of Birth: 16-Mar-1976   Diagnosis:   REFERRING PROVIDER: Brantley Stage, NP   REFERRING DIAG:  N39.41 (ICD-10-CM) - Urge incontinence  M79.18 (ICD-10-CM) - Myalgia, other site   THERAPY DIAG:  Pelvic pain  Other lack of coordination  Other low back pain  The above patient had been seen in Physical Therapy 7 times of 10 treatments scheduled with 1 no shows and 2 cancellations.  The treatment consisted of decreasing pain, downregulation of nervous system, improving PFM coordination, improving IAP management, and improving PFM strength  The patient is: Improved  Subjective:  IE: 03/13/22 - Pt is having some bladder issues. Pt feels like she has confidence in her bladder but occasionally will have dribbling with coughing/sneezing/sexual activity. Pt is also having a lot of pelvic pain with penetrative sex and annual GYN exams which has been ongoing for many years.   Last session: 05/15/22 - Patient continues to be recovering from cold. Pt has had X-rays and CT scan of the chest to rule out PE and pneumonia. All tests came back negative. Pt has been doing well with minimal stress urinary incontinence.   Discharge Findings: Did not assess due to no-show and no answer to phone call   Functional Status at Discharge: Did not assess  Goals Partially Met  SHORT TERM GOALS: Target date: 04/17/2022  Patient will decrease intake of caffeinated beverages and other bladder irritants to less than 1 L in order to manage urinary frequency and bladder irritation for improved overall QOL and participation at home and in the community. Baseline: water with straw lemonade packet, up to 1.5 L of diet coke; (11/13):  continues to drink 1.5 L of diet coke and water with strawberry lemonade packet  Goal status: IN PROGRESS    LONG TERM GOALS: Target date: 05/22/2022   Patient will score  >/= 65 on FOTO Urinary Problem and at least a 5 point difference in FOTO Lumbar Spine  in order to demonstrate decreased pain, improved PFM coordination, improved IAP management and overall QOL.  Baseline: 57; (11/13): 62, 74 (Lumbar - GOAL MET)  Goal status: IN PROGRESS  2.  Patient will report decreased changing of undergarments as indicated by a 24  hour period to demonstrate improved bladder control and allow for increased participation in activities outside of the home. Baseline: 2x/day changing underwear as Pt does not use incontinence pads Goal status: NOT MET  3.  Patient will report confidence in ability to control bladder > 7/10 in order to demonstrate improved function and ability to participate more fully in activities at home and in the community. Baseline: 5/10 Goal status: NOT MET  4.  .Patient will decrease worst pain as reported on NPRS by at least 2 points to demonstrate clinically significant reduction in pain in order to restore/improve function and overall QOL. Baseline: 8/10 LBP, inability to have penetrative sex due to pain/increased pain with GYN exams  Goal status: NOT MET  5.  Patient will report less than 5 incidents of stress urinary incontinence over the course of 3 weeks while coughing/sneezing/walking to the bathroom/intercourse in order to demonstrate improved PFM coordination, strength, and function for improved overall QOL. Baseline: urinary leakage with all the above Goal status: NOT  MET  6. Patient will report a decrease in voiding intervals during the night in order to demonstrate improved control of the bladder, PFM coordination, sleep quality, and overall QOL.   Baseline: 3-4x/night   Goal status: NOT MET  Sincerely,  Filbert Berthold, PT, DPT    CC No Recipients  [place] [facility] [address] [place], [place], [address] Phone: [phone]   Fax:  [phone]  Patient: Brooke Molina  MRN: [id number]  Date of Birth: 04/03/1976

## 2022-06-19 ENCOUNTER — Ambulatory Visit: Payer: BC Managed Care – PPO

## 2022-06-23 ENCOUNTER — Other Ambulatory Visit
Admission: RE | Admit: 2022-06-23 | Discharge: 2022-06-23 | Disposition: A | Discharge status: Home / Self Care | Payer: BC Managed Care – PPO | Source: Ambulatory Visit | Attending: Family Medicine | Admitting: Family Medicine

## 2022-06-23 DIAGNOSIS — R079 Chest pain, unspecified: Secondary | ICD-10-CM | POA: Diagnosis present

## 2022-06-23 LAB — TROPONIN I (HIGH SENSITIVITY): Troponin I (High Sensitivity): <2 ng/L (ref <18)

## 2022-07-03 HISTORY — PX: PORT A CATH INJECTION (ARMC HX): HXRAD1731

## 2022-07-04 ENCOUNTER — Emergency Department: Payer: BC Managed Care – PPO

## 2022-07-04 ENCOUNTER — Other Ambulatory Visit: Payer: Self-pay

## 2022-07-04 ENCOUNTER — Emergency Department
Admission: EM | Admit: 2022-07-04 | Discharge: 2022-07-04 | Disposition: A | Discharge status: Home / Self Care | Payer: BC Managed Care – PPO | Attending: Emergency Medicine | Admitting: Emergency Medicine

## 2022-07-04 DIAGNOSIS — Z87891 Personal history of nicotine dependence: Secondary | ICD-10-CM | POA: Diagnosis not present

## 2022-07-04 DIAGNOSIS — J9811 Atelectasis: Secondary | ICD-10-CM

## 2022-07-04 DIAGNOSIS — R0789 Other chest pain: Secondary | ICD-10-CM

## 2022-07-04 DIAGNOSIS — R079 Chest pain, unspecified: Secondary | ICD-10-CM | POA: Diagnosis present

## 2022-07-04 LAB — HEPATIC FUNCTION PANEL
ALT: 16 U/L (ref 0–44)
AST: 17 U/L (ref 15–41)
Albumin: 4.1 g/dL (ref 3.5–5.0)
Alkaline Phosphatase: 49 U/L (ref 38–126)
Bilirubin, Direct: 0.1 mg/dL (ref 0.0–0.2)
Indirect Bilirubin: 0.6 mg/dL (ref 0.3–0.9)
Total Bilirubin: 0.7 mg/dL (ref 0.3–1.2)
Total Protein: 6.2 g/dL — ABNORMAL LOW (ref 6.5–8.1)

## 2022-07-04 LAB — TROPONIN I (HIGH SENSITIVITY)
Troponin I (High Sensitivity): <2 ng/L (ref <18)
Troponin I (High Sensitivity): 2 ng/L (ref <18)

## 2022-07-04 LAB — D-DIMER, QUANTITATIVE: D-Dimer, Quant: 0.3 ug/mL-FEU (ref 0.00–0.50)

## 2022-07-04 LAB — CBC
HCT: 39.5 % (ref 36.0–46.0)
Hemoglobin: 13.2 g/dL (ref 12.0–15.0)
MCH: 31.9 pg (ref 26.0–34.0)
MCHC: 33.4 g/dL (ref 30.0–36.0)
MCV: 95.4 fL (ref 80.0–100.0)
Platelets: 286 10*3/uL (ref 150–400)
RBC: 4.14 MIL/uL (ref 3.87–5.11)
RDW: 12.7 % (ref 11.5–15.5)
WBC: 9.2 10*3/uL (ref 4.0–10.5)
nRBC: 0 % (ref 0.0–0.2)

## 2022-07-04 LAB — BASIC METABOLIC PANEL
Anion gap: 7 (ref 5–15)
BUN: 8 mg/dL (ref 6–20)
CO2: 23 mmol/L (ref 22–32)
Calcium: 8.7 mg/dL — ABNORMAL LOW (ref 8.9–10.3)
Chloride: 107 mmol/L (ref 98–111)
Creatinine, Ser: 0.59 mg/dL (ref 0.44–1.00)
GFR, Estimated: >60 mL/min (ref >60)
Glucose, Bld: 102 mg/dL — ABNORMAL HIGH (ref 70–99)
Potassium: 3.7 mmol/L (ref 3.5–5.1)
Sodium: 137 mmol/L (ref 135–145)

## 2022-07-04 MED ORDER — LIDOCAINE VISCOUS HCL 2 % MT SOLN
15.0000 mL | Freq: Once | OROMUCOSAL | Status: AC
  Administered 2022-07-04: 15 mL via ORAL
  Filled 2022-07-04: qty 15

## 2022-07-04 MED ORDER — ALUM & MAG HYDROXIDE-SIMETH 200-200-20 MG/5ML PO SUSP
30.0000 mL | Freq: Once | ORAL | Status: AC
  Administered 2022-07-04: 30 mL via ORAL
  Filled 2022-07-04: qty 30

## 2022-07-04 MED ORDER — PREDNISONE 20 MG PO TABS: ORAL_TABLET | ORAL | 0 refills | Status: AC

## 2022-07-04 MED ORDER — DOXYCYCLINE HYCLATE 100 MG PO CAPS: 100.0000 mg | ORAL_CAPSULE | Freq: Two times a day (BID) | ORAL | 0 refills | Status: AC

## 2022-07-04 MED ORDER — KETOROLAC TROMETHAMINE 15 MG/ML IJ SOLN
15.0000 mg | Freq: Once | INTRAMUSCULAR | Status: AC
  Administered 2022-07-04: 15 mg via INTRAVENOUS
  Filled 2022-07-04: qty 1

## 2022-07-04 NOTE — ED Provider Notes (Signed)
D-Dimer negative. Trop negative. EKG nonischemic. Suspect pt may have underlying COPD exacerbation, possibly early PNA w/ atelectasis vs infiltrate on CXR. Will treat as such, d/c with good return precautions.   Duffy Bruce, MD 07/04/22 1946

## 2022-07-04 NOTE — ED Provider Notes (Signed)
[facility] Provider Note    Event Date/Time   First MD Initiated Contact with Patient 07/04/22 1415     (approximate)   History   Chest Pain   HPI  Brooke Molina is a 47 y.o. female  who presents to the emergency department today because of concern for chest pain. The patient states that she had a similar episode of the pain roughly 1 week ago but today was much more severe. It occurred while she was lifting a box of toilet paper. The pain is located in the central lower chest and does radiate to her right chest. It took her pain away. The patient denies any recent fevers. Denies any recent travel or known sick contacts.      Physical Exam   Triage Vital Signs: ED Triage Vitals  Enc Vitals Group     BP 07/04/22 1251 117/78     Pulse Rate 07/04/22 1251 70     Resp 07/04/22 1251 18     Temp 07/04/22 1251 98.7 F (37.1 C)     Temp Source 07/04/22 1251 Oral     SpO2 07/04/22 1251 98 %     Weight 07/04/22 1414 240 lb 1.3 oz (108.9 kg)     Height 07/04/22 1414 '5\' 10"'$  (1.778 m)     Head Circumference --      Peak Flow --      Pain Score 07/04/22 1252 6     Pain Loc --      Pain Edu? --      Excl. in [place]? --     Most recent vital signs: Vitals:   07/04/22 1251  BP: 117/78  Pulse: 70  Resp: 18  Temp: 98.7 F (37.1 C)  SpO2: 98%   General: Awake, alert, oriented. CV:  Good peripheral perfusion. Regular rate and rhythm. Resp:  Normal effort. Lungs clear. Abd:  No distention.    ED Results / Procedures / Treatments   Labs (all labs ordered are listed, but only abnormal results are displayed) Labs Reviewed  BASIC METABOLIC PANEL - Abnormal; Notable for the following components:      Result Value   Glucose, Bld 102 (*)    Calcium 8.7 (*)    All other components within normal limits  HEPATIC FUNCTION PANEL - Abnormal; Notable for the following components:   Total Protein 6.2 (*)    All other components within normal limits  CBC   D-DIMER, QUANTITATIVE  POC URINE PREG, ED  TROPONIN I (HIGH SENSITIVITY)  TROPONIN I (HIGH SENSITIVITY)    EKG  I, Nance Pear, attending physician, personally viewed and interpreted this EKG  EKG Time: 1249 Rate: 78 Rhythm: normal sinus rhythm Axis: normal Intervals: qtc 440 QRS: narrow ST changes: no st elevation Impression: normal ekg   RADIOLOGY I independently interpreted and visualized the CXR. My interpretation: No pneumonia Radiology interpretation:  IMPRESSION:  1. Platelike linear airspace process in the RIGHT mid chest in the  RIGHT infrahilar region, likely atelectasis.  2. No lobar consolidative changes.      PROCEDURES:  Critical Care performed: No  Procedures   MEDICATIONS ORDERED IN ED: Medications - No data to display   IMPRESSION / MDM / [place] / ED COURSE  I reviewed the triage vital signs and the nursing notes.  Differential diagnosis includes, but is not limited to, pneumonia, pneumothorax, PE, gastritis.  Patient's presentation is most consistent with acute presentation with potential threat to life or bodily function.  Patient presented to the emergency department today because of concern for chest pain. EKG and CXR withotu concerning findings. Initial troponin negative. Given patient's description of the pain do have some concern for possible PE so d-dimer was ordered. Patient was given GI cocktail without any significant relief. Patient signed out awaiting second troponin and d-dimer.     FINAL CLINICAL IMPRESSION(S) / ED DIAGNOSES   Chest pain   Note:  This document was prepared using Dragon voice recognition software and may include unintentional dictation errors.    Nance Pear, MD 07/04/22 [phone]

## 2022-07-04 NOTE — ED Provider Triage Note (Signed)
Emergency Medicine Provider Triage Evaluation Note  Brooke Molina , a 47 y.o. female  was evaluated in triage.  Pt complains of chest pain, sharp pain on left then sharp pain on right, now back to left.  Review of Systems  Positive:  Negative:   Physical Exam  BP 117/78   Pulse 70   Temp 98.7 F (37.1 C) (Oral)   Resp 18   LMP 06/20/2022   SpO2 98%  Gen:   Awake, no distress   Resp:  Normal effort  MSK:   Moves extremities without difficulty  Other:    Medical Decision Making  Medically screening exam initiated at 1:01 PM.  Appropriate orders placed.  Brooke Molina was informed that the remainder of the evaluation will be completed by another provider, this initial triage assessment does not replace that evaluation, and the importance of remaining in the ED until their evaluation is complete.     Versie Starks, PA-C 07/04/22 1302

## 2022-07-04 NOTE — ED Triage Notes (Signed)
Pt to ED via POV from [facility]. Pt was at work when she started having sudden onset of right sided breast/CP that radiated into her neck. Pt also reports light headed. Pt quit smoking 2 days ago and is on chantix.

## 2022-07-04 NOTE — Discharge Instructions (Signed)
Use the albuterol inhaler every 4 hours while awake  Take ibuprofen for pain  Take the steroids as prescribed  Start the antibiotics if you start to develop any worsening cough, phlegm production, fevers, or infectious symptoms

## 2022-10-11 ENCOUNTER — Other Ambulatory Visit: Payer: Self-pay | Admitting: Sports Medicine

## 2022-10-11 DIAGNOSIS — M25551 Pain in right hip: Secondary | ICD-10-CM

## 2022-10-11 DIAGNOSIS — M25851 Other specified joint disorders, right hip: Secondary | ICD-10-CM

## 2022-10-11 DIAGNOSIS — M1611 Unilateral primary osteoarthritis, right hip: Secondary | ICD-10-CM

## 2022-10-11 DIAGNOSIS — M47818 Spondylosis without myelopathy or radiculopathy, sacral and sacrococcygeal region: Secondary | ICD-10-CM

## 2022-10-19 ENCOUNTER — Ambulatory Visit
Admission: RE | Admit: 2022-10-19 | Discharge: 2022-10-19 | Disposition: A | Discharge status: Home / Self Care | Payer: BC Managed Care – PPO | Source: Ambulatory Visit | Attending: Sports Medicine | Admitting: Sports Medicine

## 2022-10-19 DIAGNOSIS — M25551 Pain in right hip: Secondary | ICD-10-CM | POA: Insufficient documentation

## 2022-10-19 DIAGNOSIS — M25851 Other specified joint disorders, right hip: Secondary | ICD-10-CM

## 2022-10-19 DIAGNOSIS — M1611 Unilateral primary osteoarthritis, right hip: Secondary | ICD-10-CM | POA: Insufficient documentation

## 2022-10-19 DIAGNOSIS — M47818 Spondylosis without myelopathy or radiculopathy, sacral and sacrococcygeal region: Secondary | ICD-10-CM | POA: Diagnosis present

## 2022-12-27 ENCOUNTER — Other Ambulatory Visit: Payer: Self-pay | Admitting: Orthopedic Surgery

## 2023-01-05 ENCOUNTER — Inpatient Hospital Stay: Admission: RE | Admit: 2023-01-05 | Payer: BC Managed Care – PPO | Source: Ambulatory Visit

## 2023-01-10 ENCOUNTER — Inpatient Hospital Stay: Admission: RE | Admit: 2023-01-10 | Payer: BC Managed Care – PPO | Source: Ambulatory Visit

## 2023-01-18 ENCOUNTER — Ambulatory Visit: Admit: 2023-01-18 | Payer: BC Managed Care – PPO | Admitting: Orthopedic Surgery

## 2023-01-18 SURGERY — ARTHROPLASTY, HIP, TOTAL, ANTERIOR APPROACH
Anesthesia: Choice | Site: Hip | Laterality: Right

## 2023-02-02 IMAGING — MR MR HEAD W/O CM
12 series · 48 of 48 positions shown · non-contrast
Comparison: Same-day noncontrast CT head

CLINICAL DATA: TIA, left eye blurriness

EXAM:
MRI HEAD WITHOUT CONTRAST
TECHNIQUE: Multiplanar, multiecho pulse sequences of the brain and surrounding
structures were obtained without intravenous contrast.

[Series 5: ax dwi_tracew · axial · 3.0mm · 0.65mm/px · z∈[-98,+49]mm · 2 of 46 slices shown]
[im 1/46]
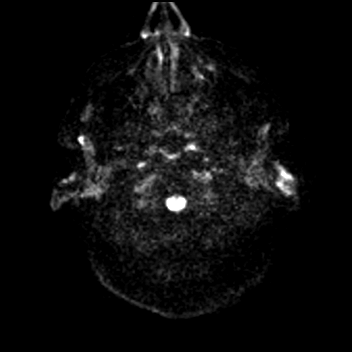
[im 46/46]
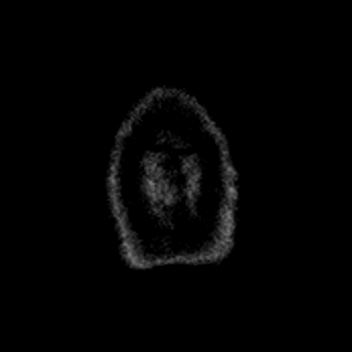

[Series 6: ax dwi_adc · axial · 3.0mm · 0.65mm/px · z∈[-98,+49]mm · 3 of 46 slices shown]
[im 1/46]
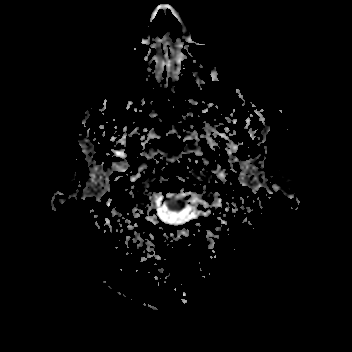
[im 23/46]
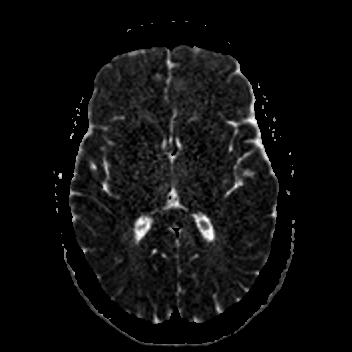
[im 46/46]
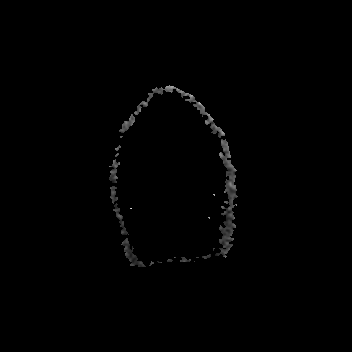

[Series 7: cor dwi_tracew · coronal · 5.0mm · 0.60mm/px · 3 of 38 slices shown]
[im 1/38]
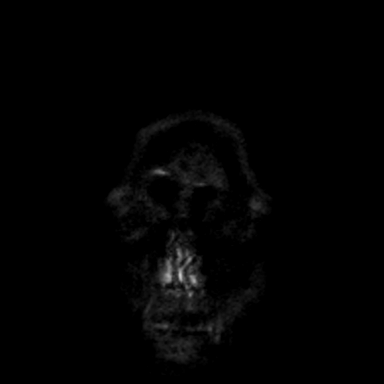
[im 19/38]
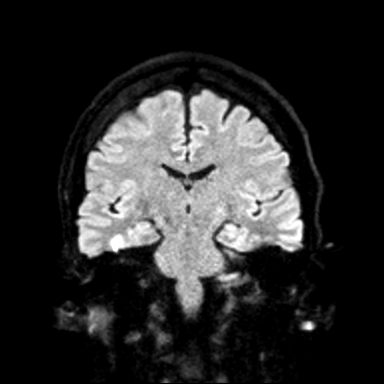
[im 38/38]
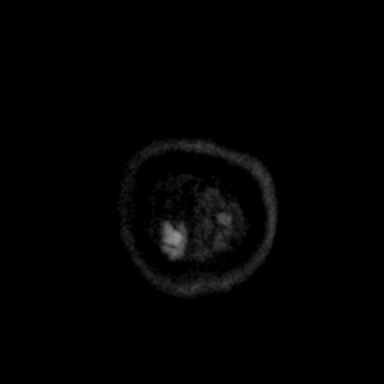

[Series 8: cor dwi_adc · coronal · 5.0mm · 0.60mm/px · 3 of 38 slices shown]
[im 1/38]
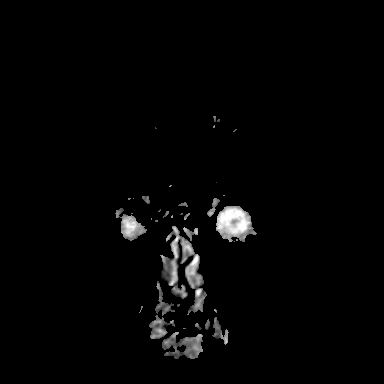
[im 19/38]
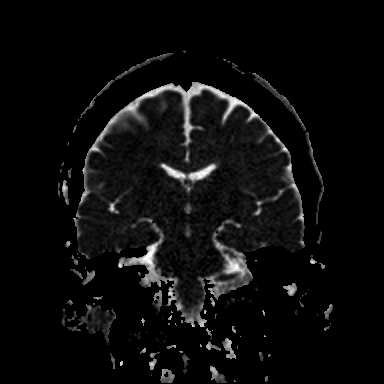
[im 38/38]
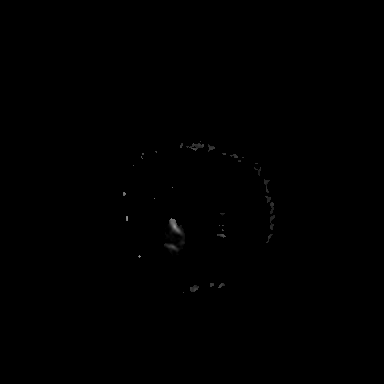

[Series 9: T1 · sagittal · 5.0mm · 0.62mm/px · 2 of 22 slices shown (1 of 2)]
[im 1/22]
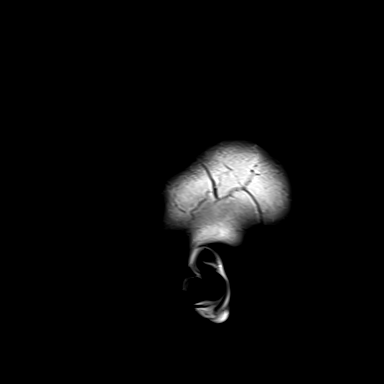
[im 22/22]
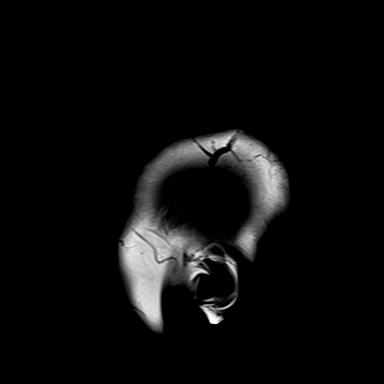

[Series 10: T2 · axial · 5.0mm · 0.53mm/px · z∈[-94,+48]mm · 2 of 25 slices shown (1 of 2)]
[im 1/25]
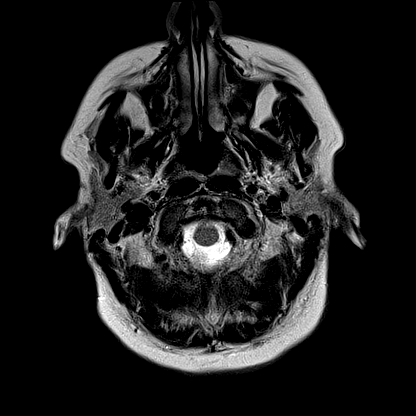
[im 25/25]
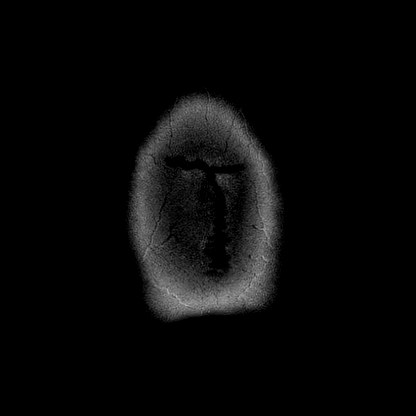

[Series 11: mag_images · axial · 3.0mm · 0.90mm/px · z∈[-112,+64]mm · 5 of 60 slices shown]
[im 1/60]
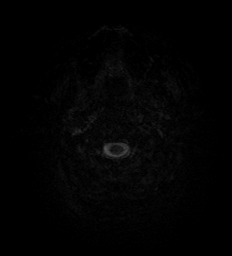
[im 15/60]
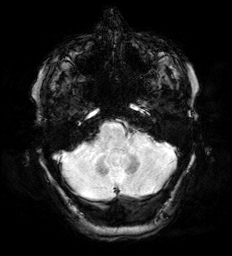
[im 30/60]
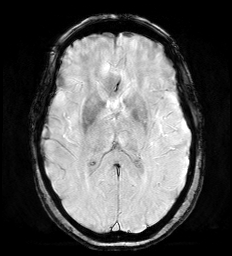
[im 45/60]
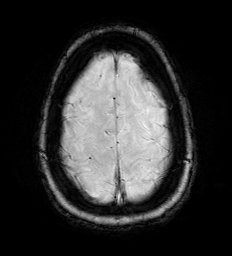
[im 60/60]
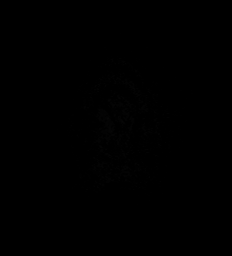

[Series 12: pha_images · axial · 3.0mm · 0.90mm/px · z∈[-112,+64]mm · 5 of 60 slices shown]
[im 1/60]
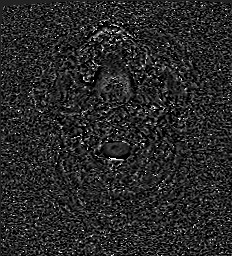
[im 15/60]
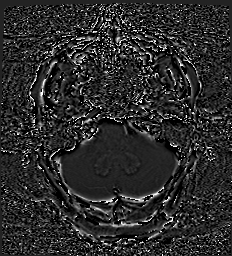
[im 30/60]
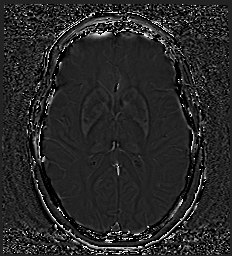
[im 45/60]
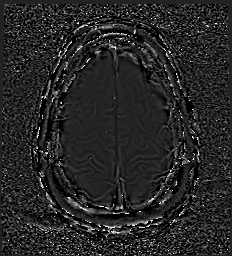
[im 60/60]
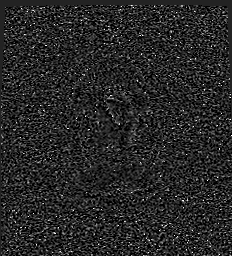

[Series 13: swi_images · axial · 3.0mm · 0.90mm/px · z∈[-112,+64]mm · 5 of 60 slices shown]
[im 1/60]
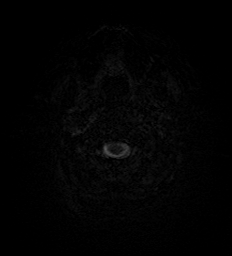
[im 15/60]
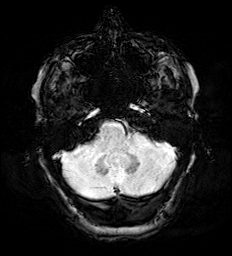
[im 30/60]
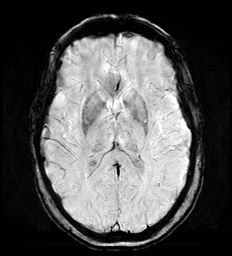
[im 45/60]
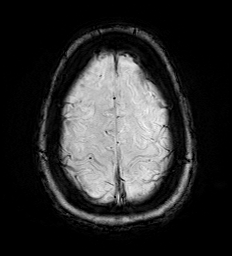
[im 60/60]
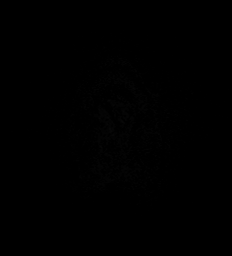

[Series 15: FLAIR · axial · 3.0mm · 0.53mm/px · z∈[-103,+57]mm · 4 of 55 slices shown]
[im 1/55]
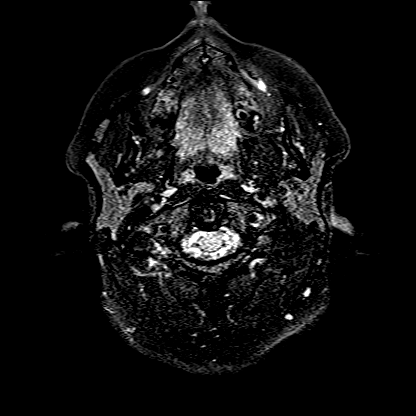
[im 19/55]
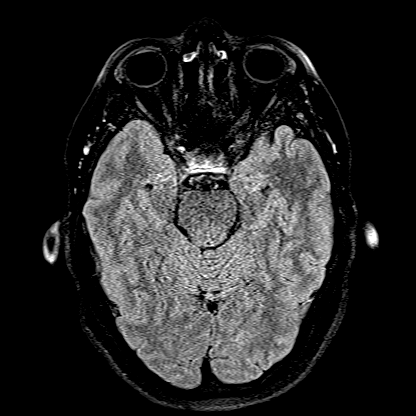
[im 37/55]
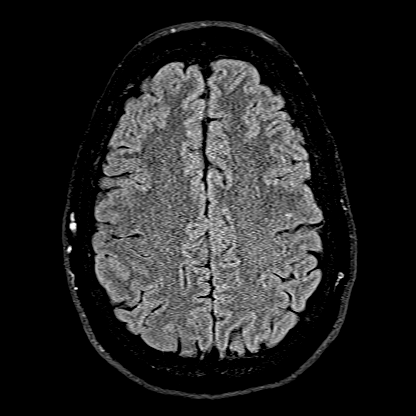
[im 55/55]
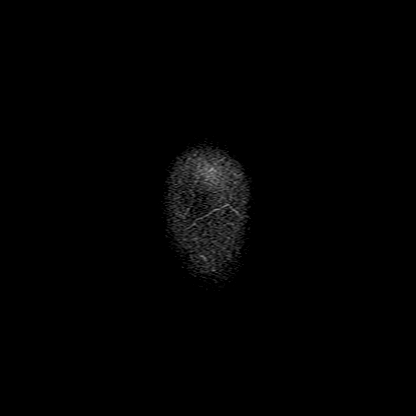

[Series 16: T1 · axial · 1.0mm · 0.98mm/px · z∈[-101,+56]mm · 12 of 160 slices shown (2 of 2)]
[im 1/160]
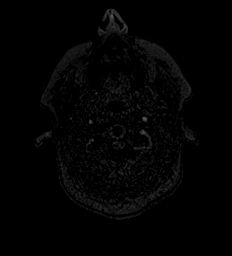
[im 15/160]
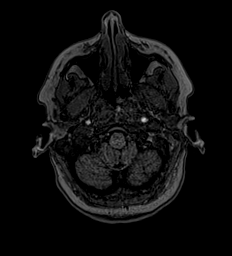
[im 29/160]
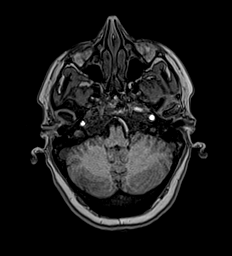
[im 44/160]
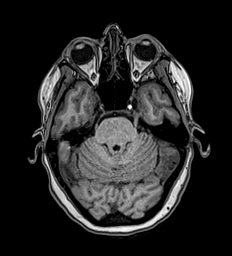
[im 58/160]
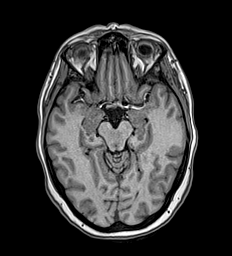
[im 73/160]
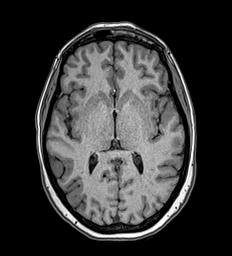
[im 87/160]
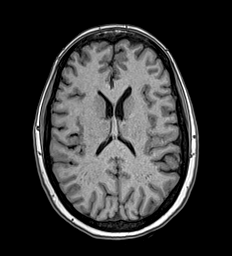
[im 102/160]
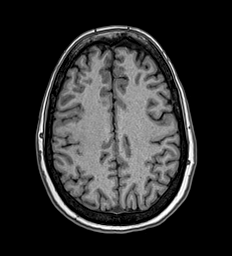
[im 116/160]
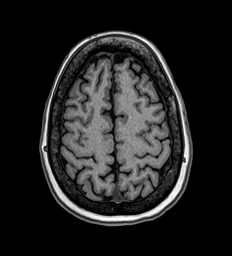
[im 131/160]
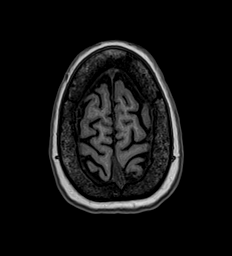
[im 145/160]
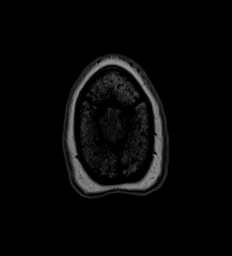
[im 160/160]
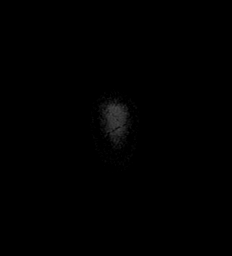

[Series 17: T2 · coronal · 5.0mm · 0.65mm/px · 2 of 29 slices shown (2 of 2)]
[im 1/29]
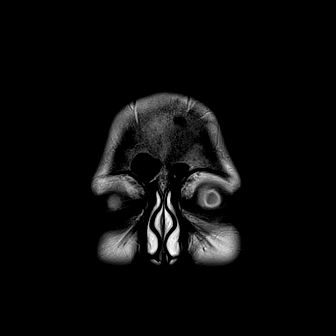
[im 29/29]
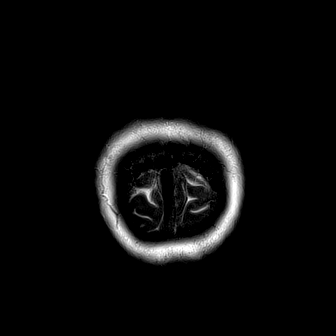

[48 of 48 positions shown; findings below may reference images not displayed]

FINDINGS: Brain: There is no evidence of acute intracranial hemorrhage,
extra-axial fluid collection, or infarct. The ventricles are normal
in size. The parenchymal signal is normal. There is no mass lesion.
There is no midline shift.

Vascular: Normal flow voids.

Skull and upper cervical spine: Normal marrow signal.

Sinuses/Orbits: The globes are unremarkable. The optic nerves are
unremarkable on these nondedicated sequences. There is no orbital
mass. The paranasal sinuses are clear.

Other: There is a 0.8 cm x 0.6 cm T2 hyperintense lesion in the left
nasopharynx. There are left larger than right mastoid effusions.
IMPRESSION: 1. No acute intracranial pathology.
2. Subcentimeter T2 hyperintense lesion in the left nasopharynx
likely reflects a benign mucous retention cyst; however, recommend
correlation with direct visualization.
3. Left larger than right mastoid effusions suggesting the
above-described lesion may be obstructing the eustachian tubes.

## 2023-02-02 IMAGING — CT CT HEAD W/O CM
3 series · 16 of 47 positions shown, 19 images · non-contrast
Comparison: 09/24/2020

CLINICAL DATA: Blurry vision.  Transient ischemic attack.

EXAM:
CT HEAD WITHOUT CONTRAST
TECHNIQUE: Contiguous axial images were obtained from the base of the skull
through the vertex without intravenous contrast.

[Series 2: head wo · axial · 0.47mm/px · z∈[-153,-8]mm · 10 of 35 slices shown, 13 images]
[im 3/35  brain]
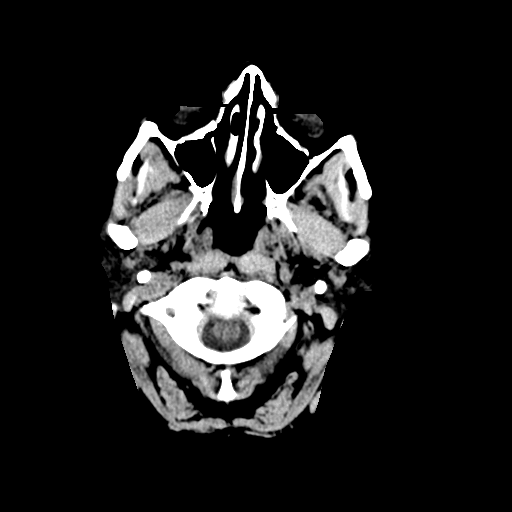
[im 3/35  bone]
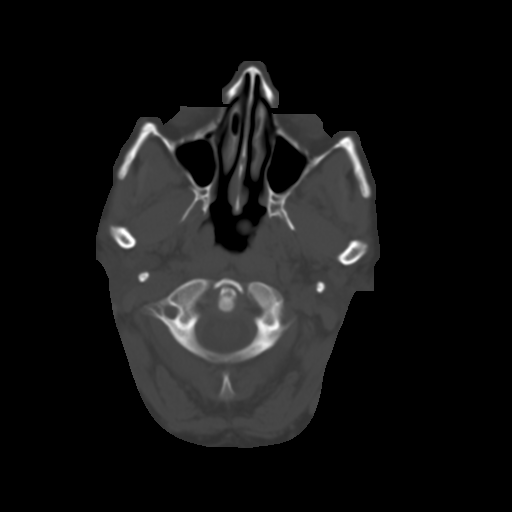
[im 6/35  brain]
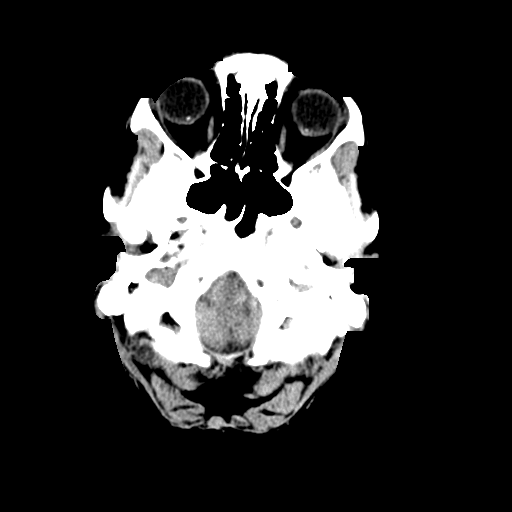
[im 10/35  brain]
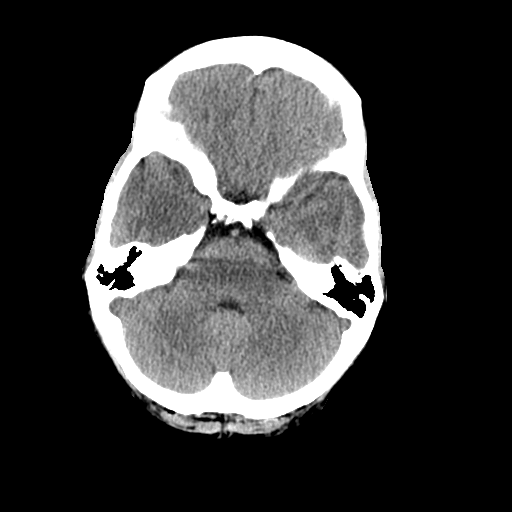
[im 12/35  brain]
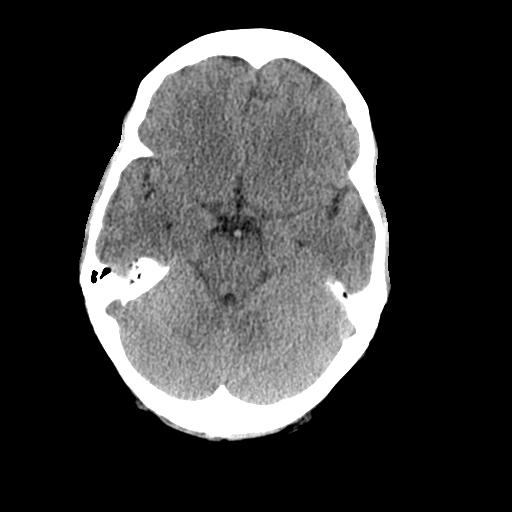
[im 16/35  brain]
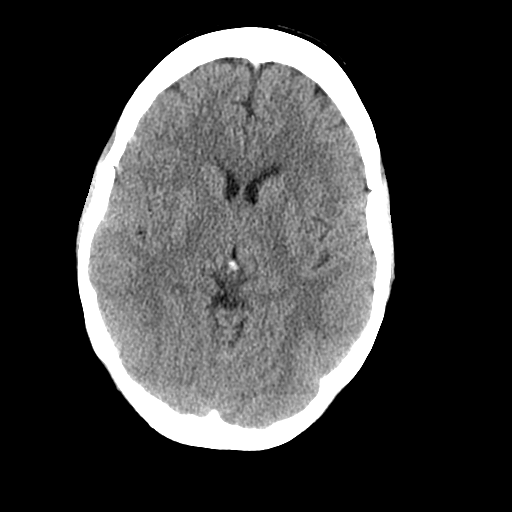
[im 16/35  bone]
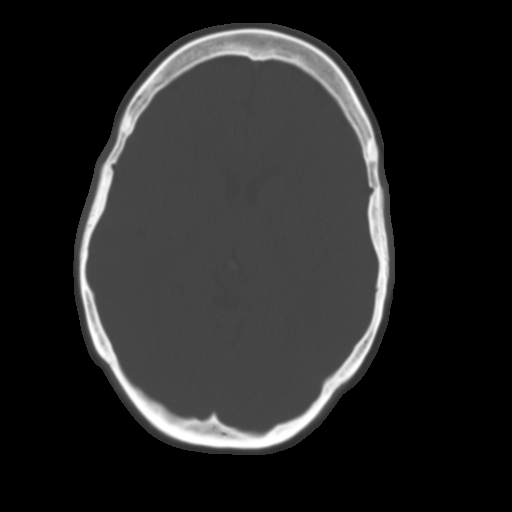
[im 19/35  brain]
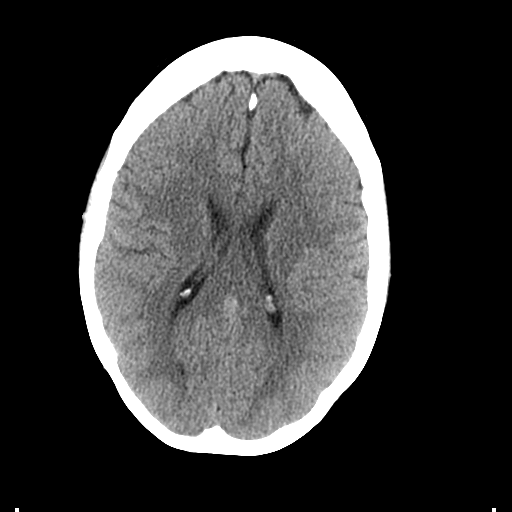
[im 23/35  brain]
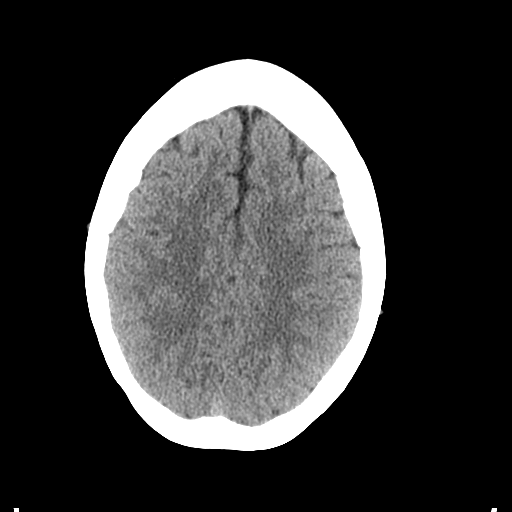
[im 26/35  brain]
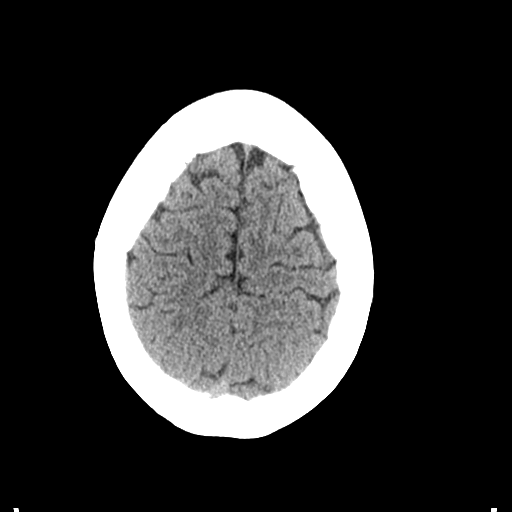
[im 29/35  brain]
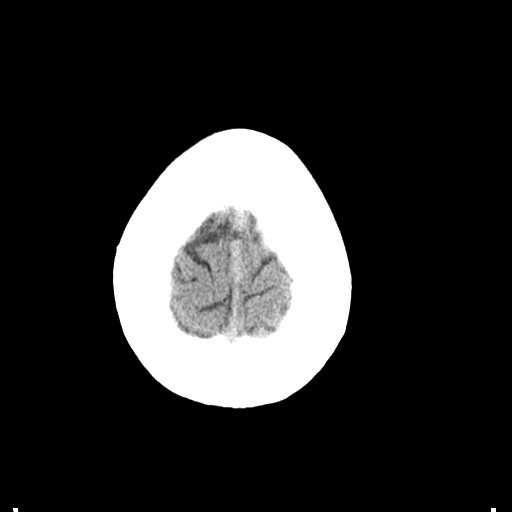
[im 29/35  bone]
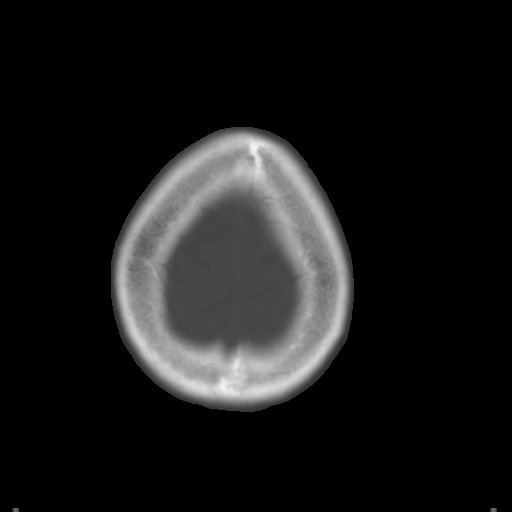
[im 32/35  brain]
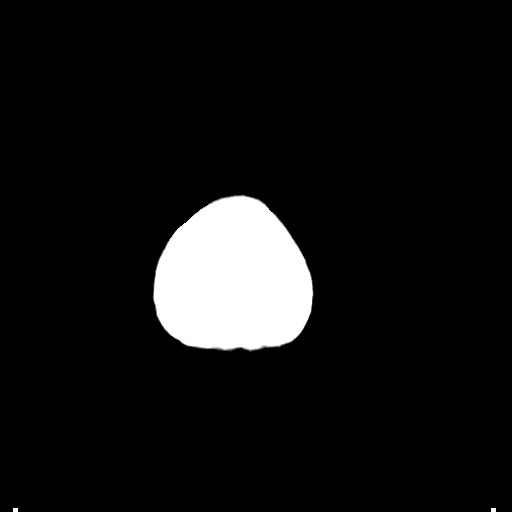

[Series 4: coronal soft tissue · coronal · 0.34mm/px · 3 of 73 slices shown]
[im 25/73  brain]
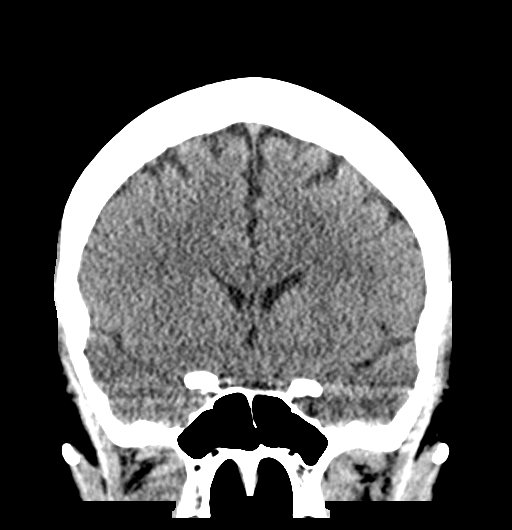
[im 33/73  brain]
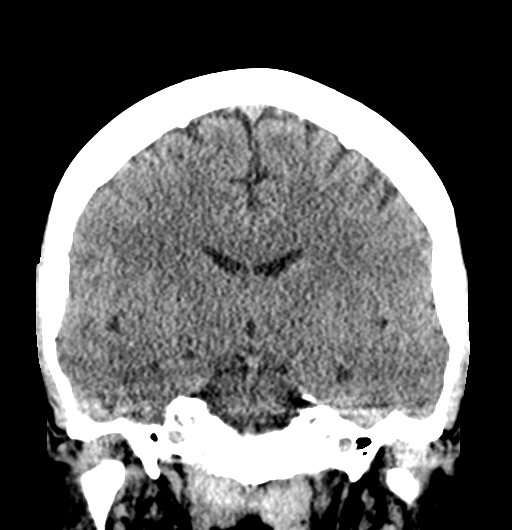
[im 41/73  brain]
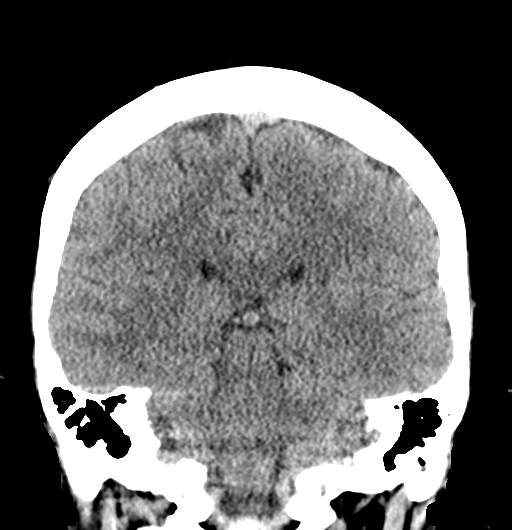

[Series 5: sagittal soft tissue · sagittal · 0.35mm/px · 3 of 58 slices shown]
[im 20/58  brain]
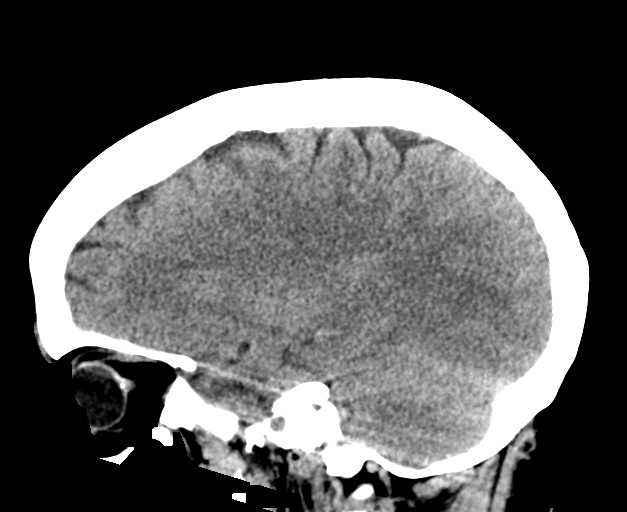
[im 29/58  brain]
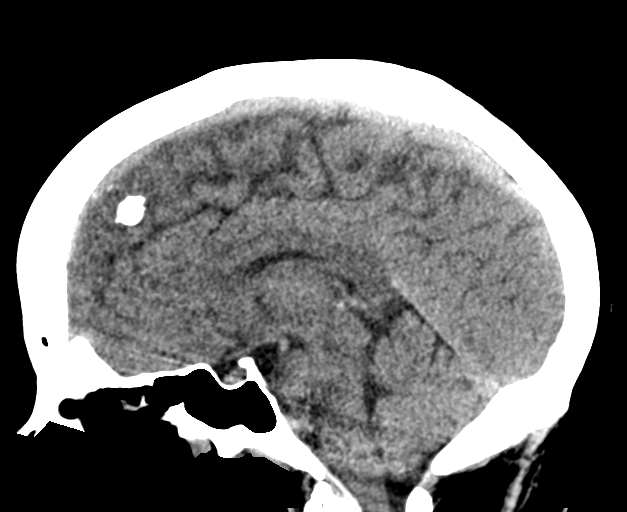
[im 39/58  brain]
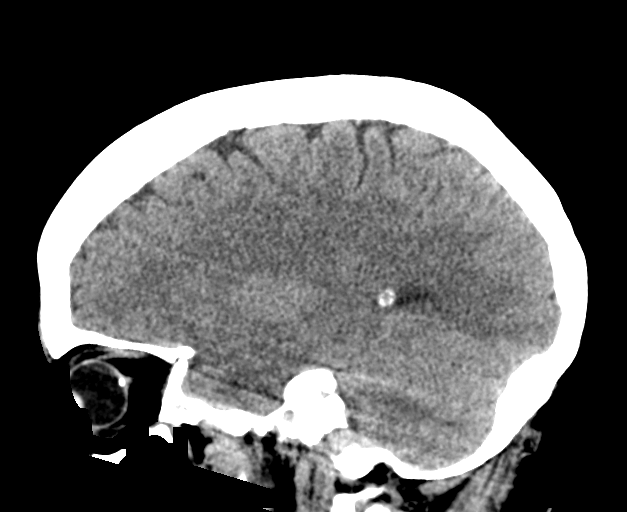

[16 of 47 positions shown; findings below may reference images not displayed]

FINDINGS: Brain: There is no evidence for acute hemorrhage, hydrocephalus,
mass lesion, or abnormal extra-axial fluid collection. No definite
CT evidence for acute infarction.

Vascular: No hyperdense vessel or unexpected calcification.

Skull: No evidence for fracture. No worrisome lytic or sclerotic
lesion.

Sinuses/Orbits: The visualized paranasal sinuses and mastoid air
cells are clear. Visualized portions of the globes and intraorbital
fat are unremarkable.

Other: None.
IMPRESSION: No acute intracranial abnormality.

## 2023-08-13 ENCOUNTER — Other Ambulatory Visit: Payer: Self-pay | Admitting: Orthopedic Surgery

## 2023-09-05 ENCOUNTER — Encounter
Admission: RE | Admit: 2023-09-05 | Discharge: 2023-09-05 | Disposition: A | Discharge status: Home / Self Care | Payer: BC Managed Care – PPO | Source: Ambulatory Visit | Attending: Orthopedic Surgery | Admitting: Orthopedic Surgery

## 2023-09-05 ENCOUNTER — Other Ambulatory Visit: Payer: Self-pay

## 2023-09-05 VITALS — BP 120/79 | HR 75 | Resp 16 | Ht 70.0 in | Wt 231.3 lb

## 2023-09-05 DIAGNOSIS — Z01818 Encounter for other preprocedural examination: Secondary | ICD-10-CM | POA: Diagnosis present

## 2023-09-05 DIAGNOSIS — R001 Bradycardia, unspecified: Secondary | ICD-10-CM | POA: Diagnosis not present

## 2023-09-05 DIAGNOSIS — Z01812 Encounter for preprocedural laboratory examination: Secondary | ICD-10-CM

## 2023-09-05 HISTORY — DX: Other specified chronic obstructive pulmonary disease: J44.89

## 2023-09-05 HISTORY — DX: Irregular menstruation, unspecified: N92.6

## 2023-09-05 HISTORY — DX: Solitary pulmonary nodule: R91.1

## 2023-09-05 HISTORY — DX: Hereditary and idiopathic neuropathy, unspecified: G60.9

## 2023-09-05 HISTORY — DX: Unilateral primary osteoarthritis, right hip: M16.11

## 2023-09-05 HISTORY — DX: Mucopurulent chronic bronchitis: J41.1

## 2023-09-05 HISTORY — DX: Other sprain of right hip, initial encounter: S73.191A

## 2023-09-05 HISTORY — DX: Intramural leiomyoma of uterus: D25.1

## 2023-09-05 LAB — URINALYSIS, ROUTINE W REFLEX MICROSCOPIC
Bilirubin Urine: NEGATIVE
Glucose, UA: NEGATIVE mg/dL
Hgb urine dipstick: NEGATIVE
Ketones, ur: NEGATIVE mg/dL
Leukocytes,Ua: NEGATIVE
Nitrite: NEGATIVE
Protein, ur: NEGATIVE mg/dL
Specific Gravity, Urine: 1.002 — ABNORMAL LOW (ref 1.005–1.030)
pH: 7 (ref 5.0–8.0)

## 2023-09-05 LAB — TYPE AND SCREEN
ABO/RH(D): O POS
Antibody Screen: NEGATIVE

## 2023-09-05 LAB — SURGICAL PCR SCREEN
MRSA, PCR: NEGATIVE
Staphylococcus aureus: NEGATIVE

## 2023-09-05 NOTE — Patient Instructions (Addendum)
 Your procedure is scheduled on: 09/20/23 - Thursday Report to the Registration Desk on the 1st floor of [facility]. To find out your arrival time, please call [phone] between 1PM - 3PM on: 09/19/23 - Wednesday If your arrival time is 6:00 am, do not arrive before that time as the [facility] entrance doors do not open until 6:00 am.  REMEMBER: Instructions that are not followed completely may result in serious medical risk, up to and including death; or upon the discretion of your surgeon and anesthesiologist your surgery may need to be rescheduled.  Do not eat food after midnight the night before surgery.  No gum chewing or hard candies.  You may however, drink CLEAR liquids up to 2 hours before you are scheduled to arrive for your surgery. Do not drink anything within 2 hours of your scheduled arrival time.  Clear liquids include: - water  - apple juice without pulp - gatorade (not RED colors) - black coffee or tea (Do NOT add milk or creamers to the coffee or tea) Do NOT drink anything that is not on this list.  In addition, your doctor has ordered for you to drink the provided:  Ensure Pre-Surgery Clear Carbohydrate Drink  Drinking this carbohydrate drink up to two hours before surgery helps to reduce insulin resistance and improve patient outcomes. Please complete drinking 2 hours before scheduled arrival time.  One week prior to surgery: Stop Anti-inflammatories (NSAIDS) such as Advil, Aleve, Ibuprofen, Motrin, Naproxen, Naprosyn and Aspirin based products such as Excedrin, Goody's Powder, BC Powder. You may take Tylenol if needed for pain up until the day of surgery.  Stop ANY OVER THE COUNTER supplements until after surgery.  ON THE DAY OF SURGERY ONLY TAKE THESE MEDICATIONS WITH SIPS OF WATER:  celecoxib (CELEBREX)  TRELEGY ELLIPTA  gabapentin (NEURONTIN)  hydroxychloroquine (PLAQUENIL)   Use inhaler on the day of surgery and bring to the hospital.   No  Alcohol for 24 hours before or after surgery.  No Smoking including e-cigarettes for 24 hours before surgery.  No chewable tobacco products for at least 6 hours before surgery.  No nicotine patches on the day of surgery.  Do not use any "recreational" drugs for at least a week (preferably 2 weeks) before your surgery.  Please be advised that the combination of cocaine and anesthesia may have negative outcomes, up to and including death. If you test positive for cocaine, your surgery will be cancelled.  On the morning of surgery brush your teeth with toothpaste and water, you may rinse your mouth with mouthwash if you wish. Do not swallow any toothpaste or mouthwash.  Use CHG Soap or wipes as directed on instruction sheet.  Do not wear jewelry, make-up, hairpins, clips or nail polish.  For welded (permanent) jewelry: bracelets, anklets, waist bands, etc.  Please have this removed prior to surgery.  If it is not removed, there is a chance that hospital personnel will need to cut it off on the day of surgery.  Do not wear lotions, powders, or perfumes.   Do not shave body hair from the neck down 48 hours before surgery.  Contact lenses, hearing aids and dentures may not be worn into surgery.  Do not bring valuables to the hospital. [facility] is not responsible for any missing/lost belongings or valuables.   Notify your doctor if there is any change in your medical condition (cold, fever, infection).  Wear comfortable clothing (specific to your surgery type) to  the hospital.  After surgery, you can help prevent lung complications by doing breathing exercises.  Take deep breaths and cough every 1-2 hours. Your doctor may order a device called an Incentive Spirometer to help you take deep breaths. When coughing or sneezing, hold a pillow firmly against your incision with both hands. This is called "splinting." Doing this helps protect your incision. It also decreases belly  discomfort.  If you are being admitted to the hospital overnight, leave your suitcase in the car. After surgery it may be brought to your room.  In case of increased patient census, it may be necessary for you, the patient, to continue your postoperative care in the Same Day Surgery department.  If you are being discharged the day of surgery, you will not be allowed to drive home. You will need a responsible individual to drive you home and stay with you for 24 hours after surgery.   If you are taking public transportation, you will need to have a responsible individual with you.  Please call the Pre-admissions Testing Dept. at [phone] if you have any questions about these instructions.  Surgery Visitation Policy:  Patients having surgery or a procedure may have two visitors.  Children under the age of 72 must have an adult with them who is not the patient.  Temporary Visitor Restrictions Due to increasing cases of flu, RSV and COVID-19: Children ages 35 and under will not be able to visit patients in [facility] hospitals under most circumstances.  Inpatient Visitation:    Visiting hours are 7 a.m. to 8 p.m. Up to four visitors are allowed at one time in a patient room. The visitors may rotate out with other people during the day.  One visitor age 28 or older may stay with the patient overnight and must be in the room by 8 p.m.    Pre-operative 5 CHG Bath Instructions   You can play a key role in reducing the risk of infection after surgery. Your skin needs to be as free of germs as possible. You can reduce the number of germs on your skin by washing with CHG (chlorhexidine gluconate) soap before surgery. CHG is an antiseptic soap that kills germs and continues to kill germs even after washing.   DO NOT use if you have an allergy to chlorhexidine/CHG or antibacterial soaps. If your skin becomes reddened or irritated, stop using the CHG and notify one of our RNs at  [phone].   Please shower with the CHG soap starting 4 days before surgery using the following schedule:   03/16 - 03/20.    Please keep in mind the following:  DO NOT shave, including legs and underarms, starting the day of your first shower.   You may shave your face at any point before/day of surgery.  Place clean sheets on your bed the day you start using CHG soap. Use a clean washcloth (not used since being washed) for each shower. DO NOT sleep with pets once you start using the CHG.   CHG Shower Instructions:  If you choose to wash your hair and private area, wash first with your normal shampoo/soap.  After you use shampoo/soap, rinse your hair and body thoroughly to remove shampoo/soap residue.  Turn the water OFF and apply about 3 tablespoons (45 ml) of CHG soap to a CLEAN washcloth.  Apply CHG soap ONLY FROM YOUR NECK DOWN TO YOUR TOES (washing for 3-5 minutes)  DO NOT use CHG soap on face,  private areas, open wounds, or sores.  Pay special attention to the area where your surgery is being performed.  If you are having back surgery, having someone wash your back for you may be helpful. Wait 2 minutes after CHG soap is applied, then you may rinse off the CHG soap.  Pat dry with a clean towel  Put on clean clothes/pajamas   If you choose to wear lotion, please use ONLY the CHG-compatible lotions on the back of this paper.     Additional instructions for the day of surgery: DO NOT APPLY any lotions, deodorants, cologne, or perfumes.   Put on clean/comfortable clothes.  Brush your teeth.  Ask your nurse before applying any prescription medications to the skin.      CHG Compatible Lotions   Aveeno Moisturizing lotion  Cetaphil Moisturizing Cream  Cetaphil Moisturizing Lotion  Clairol Herbal Essence Moisturizing Lotion, Dry Skin  Clairol Herbal Essence Moisturizing Lotion, Extra Dry Skin  Clairol Herbal Essence Moisturizing Lotion, Normal Skin  Curel Age Defying  Therapeutic Moisturizing Lotion with Alpha Hydroxy  Curel Extreme Care Body Lotion  Curel Soothing Hands Moisturizing Hand Lotion  Curel Therapeutic Moisturizing Cream, Fragrance-Free  Curel Therapeutic Moisturizing Lotion, Fragrance-Free  Curel Therapeutic Moisturizing Lotion, Original Formula  Eucerin Daily Replenishing Lotion  Eucerin Dry Skin Therapy Plus Alpha Hydroxy Crme  Eucerin Dry Skin Therapy Plus Alpha Hydroxy Lotion  Eucerin Original Crme  Eucerin Original Lotion  Eucerin Plus Crme Eucerin Plus Lotion  Eucerin TriLipid Replenishing Lotion  Keri Anti-Bacterial Hand Lotion  Keri Deep Conditioning Original Lotion Dry Skin Formula Softly Scented  Keri Deep Conditioning Original Lotion, Fragrance Free Sensitive Skin Formula  Keri Lotion Fast Absorbing Fragrance Free Sensitive Skin Formula  Keri Lotion Fast Absorbing Softly Scented Dry Skin Formula  Keri Original Lotion  Keri Skin Renewal Lotion Keri Silky Smooth Lotion  Keri Silky Smooth Sensitive Skin Lotion  Nivea Body Creamy Conditioning Oil  Nivea Body Extra Enriched Lotion  Nivea Body Original Lotion  Nivea Body Sheer Moisturizing Lotion Nivea Crme  Nivea Skin Firming Lotion  NutraDerm 30 Skin Lotion  NutraDerm Skin Lotion  NutraDerm Therapeutic Skin Cream  NutraDerm Therapeutic Skin Lotion  ProShield Protective Hand Cream  Provon moisturizing lotion  How to Use an Incentive Spirometer  An incentive spirometer is a tool that measures how well you are filling your lungs with each breath. Learning to take long, deep breaths using this tool can help you keep your lungs clear and active. This may help to reverse or lessen your chance of developing breathing (pulmonary) problems, especially infection. You may be asked to use a spirometer: After a surgery. If you have a lung problem or a history of smoking. After a long period of time when you have been unable to move or be active. If the spirometer includes an  indicator to show the highest number that you have reached, your health care provider or respiratory therapist will help you set a goal. Keep a log of your progress as told by your health care provider. What are the risks? Breathing too quickly may cause dizziness or cause you to pass out. Take your time so you do not get dizzy or light-headed. If you are in pain, you may need to take pain medicine before doing incentive spirometry. It is harder to take a deep breath if you are having pain. How to use your incentive spirometer  Sit up on the edge of your bed or on a chair. Hold the  incentive spirometer so that it is in an upright position. Before you use the spirometer, breathe out normally. Place the mouthpiece in your mouth. Make sure your lips are closed tightly around it. Breathe in slowly and as deeply as you can through your mouth, causing the piston or the ball to rise toward the top of the chamber. Hold your breath for 3-5 seconds, or for as long as possible. If the spirometer includes a coach indicator, use this to guide you in breathing. Slow down your breathing if the indicator goes above the marked areas. Remove the mouthpiece from your mouth and breathe out normally. The piston or ball will return to the bottom of the chamber. Rest for a few seconds, then repeat the steps 10 or more times. Take your time and take a few normal breaths between deep breaths so that you do not get dizzy or light-headed. Do this every 1-2 hours when you are awake. If the spirometer includes a goal marker to show the highest number you have reached (best effort), use this as a goal to work toward during each repetition. After each set of 10 deep breaths, cough a few times. This will help to make sure that your lungs are clear. If you have an incision on your chest or abdomen from surgery, place a pillow or a rolled-up towel firmly against the incision when you cough. This can help to reduce pain while  taking deep breaths and coughing. General tips When you are able to get out of bed: Walk around often. Continue to take deep breaths and cough in order to clear your lungs. Keep using the incentive spirometer until your health care provider says it is okay to stop using it. If you have been in the hospital, you may be told to keep using the spirometer at home. Contact a health care provider if: You are having difficulty using the spirometer. You have trouble using the spirometer as often as instructed. Your pain medicine is not giving enough relief for you to use the spirometer as told. You have a fever. Get help right away if: You develop shortness of breath. You develop a cough with bloody mucus from the lungs. You have fluid or blood coming from an incision site after you cough. Summary An incentive spirometer is a tool that can help you learn to take long, deep breaths to keep your lungs clear and active. You may be asked to use a spirometer after a surgery, if you have a lung problem or a history of smoking, or if you have been inactive for a long period of time. Use your incentive spirometer as instructed every 1-2 hours while you are awake. If you have an incision on your chest or abdomen, place a pillow or a rolled-up towel firmly against your incision when you cough. This will help to reduce pain. Get help right away if you have shortness of breath, you cough up bloody mucus, or blood comes from your incision when you cough. This information is not intended to replace advice given to you by your health care provider. Make sure you discuss any questions you have with your health care provider. Document Revised: 09/08/2019 Document Reviewed: 09/08/2019 Elsevier Patient Education  2023 [employer].

## 2023-09-06 ENCOUNTER — Inpatient Hospital Stay: Admission: RE | Admit: 2023-09-06 | Payer: BC Managed Care – PPO | Source: Ambulatory Visit

## 2023-09-10 ENCOUNTER — Other Ambulatory Visit: Payer: BC Managed Care – PPO

## 2023-09-19 NOTE — Anesthesia Preprocedure Evaluation (Signed)
 Anesthesia Evaluation  Patient identified by MRN, date of birth, ID band Patient awake    Reviewed: Allergy & Precautions, NPO status , Patient's Chart, lab work & pertinent test results  History of Anesthesia Complications (+) PONV and history of anesthetic complications (Vomiting while recovering on the floor)  Airway Mallampati: III  TM Distance: >3 FB Neck ROM: full    Dental  (+) Poor Dentition, Missing   Pulmonary Patient abstained from smoking., former smoker   Pulmonary exam normal        Cardiovascular negative cardio ROS Normal cardiovascular exam     Neuro/Psych  PSYCHIATRIC DISORDERS Anxiety      Neuromuscular disease (neuropathy)    GI/Hepatic negative GI ROS, Neg liver ROS,,,  Endo/Other  negative endocrine ROS    Renal/GU      Musculoskeletal  (+) Arthritis , Osteoarthritis,    Abdominal  (+) + obese  Peds  Hematology negative hematology ROS (+)   Anesthesia Other Findings Past Medical History: No date: Anxiety 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) No date: Neuromuscular disorder (HCC) No date: Osteoarthritis     Comment:  bilateral knees  Past Surgical History: No date: LYMPH GLAND EXCISION; Left     Comment:  neck No date: LYMPH NODE BIOPSY     Comment:  chest 1980: TONSILLECTOMY 09/23/2019: TOTAL KNEE ARTHROPLASTY; Left     Comment:  Procedure: LEFT TOTAL KNEE ARTHROPLASTY;  Surgeon: Kennedy Bucker, MD;  Location: [facility] ORS;  Service: Orthopedics;               Laterality: Left; No date: TUBAL LIGATION No date: TYMPANOSTOMY TUBE PLACEMENT     Reproductive/Obstetrics negative OB ROS                              Anesthesia Physical Anesthesia Plan  ASA: 2  Anesthesia Plan: Spinal   Post-op Pain Management: Regional block and Toradol IV (intra-op)   Induction:   PONV Risk Score and Plan: 2 and Ondansetron, Aprepitant and Propofol  infusion  Airway Management Planned: Nasal Cannula and Natural Airway  Additional Equipment:   Intra-op Plan:   Post-operative Plan:   Informed Consent:      Dental advisory given  Plan Discussed with: Anesthesiologist, CRNA and Surgeon  Anesthesia Plan Comments:          Anesthesia Quick Evaluation

## 2023-09-20 ENCOUNTER — Encounter: Payer: Self-pay | Admitting: Orthopedic Surgery

## 2023-09-20 ENCOUNTER — Ambulatory Visit

## 2023-09-20 ENCOUNTER — Encounter
Admission: RE | Disposition: A | Discharge status: Home Health | Payer: Self-pay | Source: Ambulatory Visit | Attending: Orthopedic Surgery

## 2023-09-20 ENCOUNTER — Other Ambulatory Visit: Payer: Self-pay

## 2023-09-20 ENCOUNTER — Ambulatory Visit
Admission: RE | Admit: 2023-09-20 | Discharge: 2023-09-21 | Disposition: A | Discharge status: Home Health | Payer: BC Managed Care – PPO | Source: Ambulatory Visit | Attending: Orthopedic Surgery | Admitting: Orthopedic Surgery

## 2023-09-20 ENCOUNTER — Ambulatory Visit: Payer: Self-pay | Admitting: Urgent Care

## 2023-09-20 DIAGNOSIS — F411 Generalized anxiety disorder: Secondary | ICD-10-CM | POA: Diagnosis not present

## 2023-09-20 DIAGNOSIS — Z96641 Presence of right artificial hip joint: Secondary | ICD-10-CM | POA: Diagnosis present

## 2023-09-20 DIAGNOSIS — X58XXXA Exposure to other specified factors, initial encounter: Secondary | ICD-10-CM | POA: Insufficient documentation

## 2023-09-20 DIAGNOSIS — S73191A Other sprain of right hip, initial encounter: Secondary | ICD-10-CM | POA: Diagnosis not present

## 2023-09-20 DIAGNOSIS — M1611 Unilateral primary osteoarthritis, right hip: Secondary | ICD-10-CM | POA: Insufficient documentation

## 2023-09-20 DIAGNOSIS — J4489 Other specified chronic obstructive pulmonary disease: Secondary | ICD-10-CM | POA: Insufficient documentation

## 2023-09-20 HISTORY — PX: TOTAL HIP ARTHROPLASTY: SHX124

## 2023-09-20 LAB — POCT PREGNANCY, URINE: Preg Test, Ur: NEGATIVE

## 2023-09-20 LAB — TYPE AND SCREEN
ABO/RH(D): O POS
Antibody Screen: NEGATIVE

## 2023-09-20 SURGERY — ARTHROPLASTY, HIP, TOTAL, ANTERIOR APPROACH
Anesthesia: Spinal | Site: Hip | Laterality: Right

## 2023-09-20 MED ORDER — HYDROCODONE-ACETAMINOPHEN 5-325 MG PO TABS
1.0000 | ORAL_TABLET | ORAL | Status: DC | PRN
  Administered 2023-09-20 – 2023-09-21 (×3): 2 via ORAL
  Filled 2023-09-20 (×3): qty 2

## 2023-09-20 MED ORDER — BUPIVACAINE LIPOSOME 1.3 % IJ SUSP
INTRAMUSCULAR | Status: AC
  Filled 2023-09-20: qty 40

## 2023-09-20 MED ORDER — ONDANSETRON HCL 4 MG PO TABS: 4.0000 mg | ORAL_TABLET | Freq: Four times a day (QID) | ORAL | Status: DC | PRN

## 2023-09-20 MED ORDER — CHLORHEXIDINE GLUCONATE 0.12 % MT SOLN
OROMUCOSAL | Status: AC
  Filled 2023-09-20: qty 15

## 2023-09-20 MED ORDER — CEFAZOLIN SODIUM-DEXTROSE 2-4 GM/100ML-% IV SOLN
2.0000 g | INTRAVENOUS | Status: AC
  Administered 2023-09-20: 2 g via INTRAVENOUS

## 2023-09-20 MED ORDER — FENTANYL CITRATE (PF) 100 MCG/2ML IJ SOLN
25.0000 ug | INTRAMUSCULAR | Status: DC | PRN
  Administered 2023-09-20: 25 ug via INTRAVENOUS

## 2023-09-20 MED ORDER — SODIUM CHLORIDE 0.9 % IR SOLN
Status: DC | PRN
  Administered 2023-09-20: 100 mL

## 2023-09-20 MED ORDER — OXYCODONE HCL 5 MG PO TABS: 5.0000 mg | ORAL_TABLET | Freq: Once | ORAL | Status: DC | PRN

## 2023-09-20 MED ORDER — DEXAMETHASONE SODIUM PHOSPHATE 10 MG/ML IJ SOLN
8.0000 mg | Freq: Once | INTRAMUSCULAR | Status: AC
  Administered 2023-09-20: 8 mg via INTRAVENOUS

## 2023-09-20 MED ORDER — MIDAZOLAM HCL 2 MG/2ML IJ SOLN
INTRAMUSCULAR | Status: AC
  Filled 2023-09-20: qty 2

## 2023-09-20 MED ORDER — MORPHINE SULFATE (PF) 2 MG/ML IV SOLN: 0.5000 mg | INTRAVENOUS | Status: DC | PRN

## 2023-09-20 MED ORDER — FENTANYL CITRATE (PF) 100 MCG/2ML IJ SOLN
INTRAMUSCULAR | Status: DC | PRN
  Administered 2023-09-20: 50 ug via INTRAVENOUS

## 2023-09-20 MED ORDER — OXYCODONE HCL 5 MG/5ML PO SOLN: 5.0000 mg | Freq: Once | ORAL | Status: DC | PRN

## 2023-09-20 MED ORDER — TRAMADOL HCL 50 MG PO TABS: 50.0000 mg | ORAL_TABLET | Freq: Four times a day (QID) | ORAL | 0 refills | Status: AC | PRN

## 2023-09-20 MED ORDER — SODIUM CHLORIDE (PF) 0.9 % IJ SOLN
INTRAMUSCULAR | Status: AC
  Filled 2023-09-20: qty 20

## 2023-09-20 MED ORDER — PHENYLEPHRINE HCL-NACL 20-0.9 MG/250ML-% IV SOLN
INTRAVENOUS | Status: AC
  Filled 2023-09-20: qty 250

## 2023-09-20 MED ORDER — ONDANSETRON HCL 4 MG/2ML IJ SOLN
INTRAMUSCULAR | Status: DC | PRN
  Administered 2023-09-20: 4 mg via INTRAVENOUS

## 2023-09-20 MED ORDER — ACETAMINOPHEN 500 MG PO TABS: 1000.0000 mg | ORAL_TABLET | Freq: Three times a day (TID) | ORAL | 0 refills | Status: AC

## 2023-09-20 MED ORDER — OXYCODONE HCL 5 MG PO TABS: 5.0000 mg | ORAL_TABLET | Freq: Four times a day (QID) | ORAL | 0 refills | Status: AC | PRN

## 2023-09-20 MED ORDER — TRANEXAMIC ACID-NACL 1000-0.7 MG/100ML-% IV SOLN
INTRAVENOUS | Status: AC
  Filled 2023-09-20: qty 100

## 2023-09-20 MED ORDER — BUPIVACAINE HCL (PF) 0.5 % IJ SOLN
INTRAMUSCULAR | Status: DC | PRN
  Administered 2023-09-20: 2.6 mg

## 2023-09-20 MED ORDER — PHENYLEPHRINE HCL-NACL 20-0.9 MG/250ML-% IV SOLN
INTRAVENOUS | Status: DC | PRN
Start: 2023-09-20 — End: 2023-09-20
  Administered 2023-09-20: 25 ug/min via INTRAVENOUS

## 2023-09-20 MED ORDER — METOCLOPRAMIDE HCL 5 MG/ML IJ SOLN: 5.0000 mg | Freq: Three times a day (TID) | INTRAMUSCULAR | Status: DC | PRN

## 2023-09-20 MED ORDER — BUPIVACAINE-EPINEPHRINE (PF) 0.25% -1:200000 IJ SOLN
INTRAMUSCULAR | Status: AC
  Filled 2023-09-20: qty 60

## 2023-09-20 MED ORDER — CEFAZOLIN SODIUM-DEXTROSE 2-4 GM/100ML-% IV SOLN
2.0000 g | Freq: Four times a day (QID) | INTRAVENOUS | Status: AC
  Administered 2023-09-20 (×2): 2 g via INTRAVENOUS
  Filled 2023-09-20 (×2): qty 100

## 2023-09-20 MED ORDER — METOCLOPRAMIDE HCL 10 MG PO TABS: 5.0000 mg | ORAL_TABLET | Freq: Three times a day (TID) | ORAL | Status: DC | PRN

## 2023-09-20 MED ORDER — DOCUSATE SODIUM 100 MG PO CAPS
100.0000 mg | ORAL_CAPSULE | Freq: Two times a day (BID) | ORAL | Status: DC
  Administered 2023-09-20 – 2023-09-21 (×3): 100 mg via ORAL
  Filled 2023-09-20 (×3): qty 1

## 2023-09-20 MED ORDER — ORAL CARE MOUTH RINSE: 15.0000 mL | Freq: Once | OROMUCOSAL | Status: AC

## 2023-09-20 MED ORDER — KETOROLAC TROMETHAMINE 15 MG/ML IJ SOLN
INTRAMUSCULAR | Status: AC
  Filled 2023-09-20: qty 1

## 2023-09-20 MED ORDER — DOCUSATE SODIUM 100 MG PO CAPS
100.0000 mg | ORAL_CAPSULE | Freq: Two times a day (BID) | ORAL | 0 refills | Status: AC
Start: 2023-09-20 — End: ?

## 2023-09-20 MED ORDER — TRAMADOL HCL 50 MG PO TABS
50.0000 mg | ORAL_TABLET | Freq: Four times a day (QID) | ORAL | Status: DC | PRN
  Administered 2023-09-20: 50 mg via ORAL
  Filled 2023-09-20: qty 1

## 2023-09-20 MED ORDER — PHENYLEPHRINE 80 MCG/ML (10ML) SYRINGE FOR IV PUSH (FOR BLOOD PRESSURE SUPPORT)
PREFILLED_SYRINGE | INTRAVENOUS | Status: DC | PRN
  Administered 2023-09-20: 80 ug via INTRAVENOUS
  Administered 2023-09-20: 160 ug via INTRAVENOUS
  Administered 2023-09-20: 80 ug via INTRAVENOUS
  Administered 2023-09-20: 160 ug via INTRAVENOUS
  Administered 2023-09-20: 80 ug via INTRAVENOUS

## 2023-09-20 MED ORDER — ENOXAPARIN SODIUM 40 MG/0.4ML IJ SOSY
40.0000 mg | PREFILLED_SYRINGE | INTRAMUSCULAR | 0 refills | Status: AC
Start: 2023-09-21 — End: 2023-10-05

## 2023-09-20 MED ORDER — TRANEXAMIC ACID-NACL 1000-0.7 MG/100ML-% IV SOLN
1000.0000 mg | INTRAVENOUS | Status: AC
  Administered 2023-09-20 (×2): 1000 mg via INTRAVENOUS

## 2023-09-20 MED ORDER — SODIUM CHLORIDE 0.9 % IV SOLN: INTRAVENOUS | Status: DC

## 2023-09-20 MED ORDER — PROPOFOL 10 MG/ML IV BOLUS
INTRAVENOUS | Status: AC
  Filled 2023-09-20: qty 20

## 2023-09-20 MED ORDER — 0.9 % SODIUM CHLORIDE (POUR BTL) OPTIME
TOPICAL | Status: DC | PRN
  Administered 2023-09-20: 500 mL

## 2023-09-20 MED ORDER — ACETAMINOPHEN 500 MG PO TABS
1000.0000 mg | ORAL_TABLET | Freq: Three times a day (TID) | ORAL | Status: DC
  Administered 2023-09-21: 1000 mg via ORAL
  Filled 2023-09-20: qty 2

## 2023-09-20 MED ORDER — ACETAMINOPHEN 325 MG PO TABS: 325.0000 mg | ORAL_TABLET | Freq: Four times a day (QID) | ORAL | Status: DC | PRN

## 2023-09-20 MED ORDER — DEXMEDETOMIDINE HCL IN NACL 80 MCG/20ML IV SOLN
INTRAVENOUS | Status: DC | PRN
  Administered 2023-09-20: 8 ug via INTRAVENOUS

## 2023-09-20 MED ORDER — MIDAZOLAM HCL 5 MG/5ML IJ SOLN
INTRAMUSCULAR | Status: DC | PRN
  Administered 2023-09-20: 2 mg via INTRAVENOUS

## 2023-09-20 MED ORDER — CHLORHEXIDINE GLUCONATE 0.12 % MT SOLN
15.0000 mL | Freq: Once | OROMUCOSAL | Status: AC
  Administered 2023-09-20: 15 mL via OROMUCOSAL

## 2023-09-20 MED ORDER — ENOXAPARIN SODIUM 40 MG/0.4ML IJ SOSY
40.0000 mg | PREFILLED_SYRINGE | INTRAMUSCULAR | Status: DC
  Administered 2023-09-21: 40 mg via SUBCUTANEOUS
  Filled 2023-09-20: qty 0.4

## 2023-09-20 MED ORDER — GABAPENTIN 300 MG PO CAPS
ORAL_CAPSULE | ORAL | Status: AC
  Filled 2023-09-20: qty 1

## 2023-09-20 MED ORDER — SURGIFLO WITH THROMBIN (HEMOSTATIC MATRIX KIT) OPTIME
TOPICAL | Status: DC | PRN
  Administered 2023-09-20: 1 via TOPICAL

## 2023-09-20 MED ORDER — GABAPENTIN 100 MG PO CAPS
300.0000 mg | ORAL_CAPSULE | Freq: Two times a day (BID) | ORAL | Status: DC
  Administered 2023-09-20 – 2023-09-21 (×3): 300 mg via ORAL
  Filled 2023-09-20: qty 3

## 2023-09-20 MED ORDER — MENTHOL 3 MG MT LOZG: 1.0000 | LOZENGE | OROMUCOSAL | Status: DC | PRN

## 2023-09-20 MED ORDER — EPHEDRINE SULFATE-NACL 50-0.9 MG/10ML-% IV SOSY
PREFILLED_SYRINGE | INTRAVENOUS | Status: DC | PRN
  Administered 2023-09-20 (×5): 5 mg via INTRAVENOUS

## 2023-09-20 MED ORDER — ACETAMINOPHEN 10 MG/ML IV SOLN: 1000.0000 mg | Freq: Once | INTRAVENOUS | Status: DC | PRN

## 2023-09-20 MED ORDER — TRAZODONE HCL 100 MG PO TABS
150.0000 mg | ORAL_TABLET | Freq: Every day | ORAL | Status: DC
  Administered 2023-09-20: 150 mg via ORAL
  Filled 2023-09-20: qty 1

## 2023-09-20 MED ORDER — ONDANSETRON HCL 4 MG/2ML IJ SOLN: 4.0000 mg | Freq: Four times a day (QID) | INTRAMUSCULAR | Status: DC | PRN

## 2023-09-20 MED ORDER — FENTANYL CITRATE (PF) 100 MCG/2ML IJ SOLN
INTRAMUSCULAR | Status: AC
  Filled 2023-09-20: qty 2

## 2023-09-20 MED ORDER — LACTATED RINGERS IV SOLN
INTRAVENOUS | Status: DC
Start: 2023-09-20 — End: 2023-09-20

## 2023-09-20 MED ORDER — DROPERIDOL 2.5 MG/ML IJ SOLN: 0.6250 mg | Freq: Once | INTRAMUSCULAR | Status: DC | PRN

## 2023-09-20 MED ORDER — PROPOFOL 1000 MG/100ML IV EMUL
INTRAVENOUS | Status: AC
  Filled 2023-09-20: qty 100

## 2023-09-20 MED ORDER — PANTOPRAZOLE SODIUM 40 MG PO TBEC
40.0000 mg | DELAYED_RELEASE_TABLET | Freq: Every day | ORAL | Status: DC
  Administered 2023-09-20 – 2023-09-21 (×2): 40 mg via ORAL
  Filled 2023-09-20 (×2): qty 1

## 2023-09-20 MED ORDER — CEFAZOLIN SODIUM-DEXTROSE 2-4 GM/100ML-% IV SOLN
INTRAVENOUS | Status: AC
  Filled 2023-09-20: qty 100

## 2023-09-20 MED ORDER — VASOPRESSIN 20 UNIT/ML IV SOLN
INTRAVENOUS | Status: DC | PRN
  Administered 2023-09-20 (×2): 1 [IU] via INTRAVENOUS

## 2023-09-20 MED ORDER — SODIUM CHLORIDE (PF) 0.9 % IJ SOLN
INTRAMUSCULAR | Status: DC | PRN
  Administered 2023-09-20: 50 mL via INTRAMUSCULAR

## 2023-09-20 MED ORDER — KETOROLAC TROMETHAMINE 15 MG/ML IJ SOLN
7.5000 mg | Freq: Four times a day (QID) | INTRAMUSCULAR | Status: AC
  Administered 2023-09-20 – 2023-09-21 (×4): 7.5 mg via INTRAVENOUS
  Filled 2023-09-20 (×3): qty 1

## 2023-09-20 MED ORDER — SURGIPHOR WOUND IRRIGATION SYSTEM - OPTIME: TOPICAL | Status: DC | PRN

## 2023-09-20 MED ORDER — PROPOFOL 500 MG/50ML IV EMUL
INTRAVENOUS | Status: DC | PRN
Start: 2023-09-20 — End: 2023-09-20
  Administered 2023-09-20: 125 ug/kg/min via INTRAVENOUS

## 2023-09-20 MED ORDER — PHENOL 1.4 % MT LIQD: 1.0000 | OROMUCOSAL | Status: DC | PRN

## 2023-09-20 SURGICAL SUPPLY — 62 items
BLADE CLIPPER SURG (BLADE) IMPLANT
BLADE SAGITTAL AGGR TOOTH XLG (BLADE) ×1 IMPLANT
BNDG COHESIVE 6X5 TAN ST LF (GAUZE/BANDAGES/DRESSINGS) ×1 IMPLANT
BRUSH SCRUB EZ PLAIN DRY (MISCELLANEOUS) ×1 IMPLANT
CHLORAPREP W/TINT 26 (MISCELLANEOUS) ×1 IMPLANT
DERMABOND ADVANCED .7 DNX12 (GAUZE/BANDAGES/DRESSINGS) ×1 IMPLANT
DRAPE C-ARM XRAY 36X54 (DRAPES) ×1 IMPLANT
DRAPE SHEET LG 3/4 BI-LAMINATE (DRAPES) ×3 IMPLANT
DRAPE TABLE BACK 80X90 (DRAPES) ×1 IMPLANT
DRSG MEPILEX SACRM 8.7X9.8 (GAUZE/BANDAGES/DRESSINGS) ×1 IMPLANT
DRSG OPSITE POSTOP 4X8 (GAUZE/BANDAGES/DRESSINGS) ×1 IMPLANT
ELECT BLADE 4.0 EZ CLEAN MEGAD (MISCELLANEOUS) ×1 IMPLANT
ELECT REM PT RETURN 9FT ADLT (ELECTROSURGICAL) ×1 IMPLANT
ELECTRODE BLDE 4.0 EZ CLN MEGD (MISCELLANEOUS) ×1 IMPLANT
ELECTRODE REM PT RTRN 9FT ADLT (ELECTROSURGICAL) ×1 IMPLANT
GLOVE BIO SURGEON STRL SZ8 (GLOVE) ×1 IMPLANT
GLOVE BIOGEL PI IND STRL 8 (GLOVE) ×1 IMPLANT
GLOVE PI ORTHO PRO STRL 7.5 (GLOVE) ×2 IMPLANT
GLOVE PI ORTHO PRO STRL SZ8 (GLOVE) ×2 IMPLANT
GLOVE SURG SYN 7.5 E (GLOVE) ×1 IMPLANT
GLOVE SURG SYN 7.5 PF PI (GLOVE) ×1 IMPLANT
GOWN SRG XL LVL 3 NONREINFORCE (GOWNS) ×1 IMPLANT
GOWN STRL REUS W/ TWL LRG LVL3 (GOWN DISPOSABLE) ×1 IMPLANT
GOWN STRL REUS W/ TWL XL LVL3 (GOWN DISPOSABLE) ×1 IMPLANT
HEAD CERAMIC FEMORAL 36MM (Head) IMPLANT
HOOD PEEL AWAY T7 (MISCELLANEOUS) ×2 IMPLANT
INSERT 0 DEGREE 36 (Miscellaneous) IMPLANT
IV NS 100ML SINGLE PACK (IV SOLUTION) ×1 IMPLANT
KIT PATIENT CARE HANA TABLE (KITS) ×1 IMPLANT
KIT TURNOVER CYSTO (KITS) ×1 IMPLANT
LIGHT WAVEGUIDE WIDE FLAT (MISCELLANEOUS) ×1 IMPLANT
MANIFOLD NEPTUNE II (INSTRUMENTS) ×1 IMPLANT
MARKER SKIN DUAL TIP RULER LAB (MISCELLANEOUS) ×1 IMPLANT
MAT ABSORB FLUID 56X50 GRAY (MISCELLANEOUS) ×1 IMPLANT
NDL SPNL 20GX3.5 QUINCKE YW (NEEDLE) ×1 IMPLANT
NEEDLE SPNL 20GX3.5 QUINCKE YW (NEEDLE) ×1 IMPLANT
NS IRRIG 500ML POUR BTL (IV SOLUTION) ×1 IMPLANT
PACK HIP COMPR (MISCELLANEOUS) ×1 IMPLANT
PAD ARMBOARD POSITIONER FOAM (MISCELLANEOUS) ×1 IMPLANT
PENCIL SMOKE EVACUATOR (MISCELLANEOUS) ×1 IMPLANT
SCREW HEX LP 6.5X20 (Screw) IMPLANT
SCREW HEX LP 6.5X30 (Screw) IMPLANT
SHELL ACETAB TRIDENT 48 (Shell) IMPLANT
SLEEVE SCD COMPRESS KNEE MED (STOCKING) ×1 IMPLANT
SOLUTION IRRIG SURGIPHOR (IV SOLUTION) ×1 IMPLANT
STEM STD OFFSET SZ4 32.5 (Stem) IMPLANT
SURGIFLO W/THROMBIN 8M KIT (HEMOSTASIS) IMPLANT
SUT BONE WAX W31G (SUTURE) ×1 IMPLANT
SUT ETHIBOND 2 V 37 (SUTURE) ×1 IMPLANT
SUT SILK 0 30XBRD TIE 6 (SUTURE) ×1 IMPLANT
SUT STRATA 1 CT-1 DLB (SUTURE) ×1 IMPLANT
SUT STRATAFIX 14 PDO 48 VLT (SUTURE) ×1 IMPLANT
SUT STRATAFIX PDO 1 14 VIOLET (SUTURE) ×1 IMPLANT
SUT VIC AB 0 CT1 36 (SUTURE) ×1 IMPLANT
SUT VIC AB 2-0 CT2 27 (SUTURE) ×1 IMPLANT
SUTURE STRATA SPIR 4-0 18 (SUTURE) ×1 IMPLANT
SYR 20ML LL LF (SYRINGE) ×2 IMPLANT
TAPE MICROFOAM 4IN (TAPE) IMPLANT
TOWEL OR 17X26 4PK STRL BLUE (TOWEL DISPOSABLE) IMPLANT
TRAP FLUID SMOKE EVACUATOR (MISCELLANEOUS) ×1 IMPLANT
WAND WEREWOLF FASTSEAL 6.0 (MISCELLANEOUS) ×1 IMPLANT
WATER STERILE IRR 1000ML POUR (IV SOLUTION) ×1 IMPLANT

## 2023-09-20 NOTE — Op Note (Signed)
 Patient Name: Brooke Molina  FAO:[id number]  Pre-Operative Diagnosis: Right hip Osteoarthritis  Post-Operative Diagnosis: (same)  Procedure: Right Total Hip Arthroplasty  Components/Implants: Cup: Trident Tritanium clusterhole 48/D w/ x2 screws    Liner: Neutral X3 poly 36/D  Stem: Insignia #4 std offset  Head:Biolox ceramic 36 +92mm   Date of Surgery: 09/20/2023  Surgeon: Reinaldo Berber MD  Assistant: Amador Cunas PA (present and scrubbed throughout the case, critical for assistance with exposure, retraction, instrumentation, and closure), [place] PAs   Anesthesiologist: Laural Benes  Anesthesia: Spinal   EBL: 200cc  IVF:700cc  Complications: None   Brief history: The patient is a 48 year old female with a history of osteoarthritis and labral tears of the right hip with pain limiting their range of motion and activities of daily living, which has failed multiple attempts at conservative therapy.  The risks and benefits of total hip arthroplasty as definitive surgical treatment were discussed with the patient, who opted to proceed with the operation.  After outpatient medical clearance and optimization was completed the patient was admitted to [facility] for the procedure.  All preoperative films were reviewed and an appropriate surgical plan was made prior to surgery.   Description of procedure: The patient was brought to the operating room where laterality was confirmed by all those present to be the right side.  The patient was administered spinal anesthesia on a stretcher prior to being moved supine on the operating room table. Patient was given an intravenous dose of antibiotics for surgical prophylaxis and TXA .  All bony prominences and extremities were well padded and the patient was securely attached to the table boots, a perineal post was placed and the patient had a safety strap placed.  Surgical site was prepped with alcohol and chlorhexidine. The  surgical site over the hip was and draped in typical sterile fashion with multiple layers of adhesive and nonadhesive drapes.  The incision site was marked out with a sterile marker and care was taken to assess the position of the ASIS and ensure appropriate position for the incision.    A surgical timeout was then called with participation of all staff in the room the patient was then a confirmed again and laterality confirmed.  Incision was made over the anterior lateral aspect of the proximal thigh in line with the TFL.  Appropriate retractors were placed and all bleeding vessels were coagulated within the subcutaneous and fatty layers.  An incision was made in the TFL fascia in the interval was carefully identified.  The lateral ascending branches of the circumflex vessels were identified, cauterized and carefully dissected. The main vessels were then tied with a 0 silk hand tie.  Retractors were placed around the superior lateral and inferior medial aspects of the femoral neck and a capsulotomy was performed exposing the hip joint.  Retraction stitches were placed and the capsulotomy to assist with visualization.  A large cam lesion with bony osteophytes along the anterior femoral neck were encountered. Femoral neck cut was then made and the femoral head was extracted after placing the leg in traction.  Bone wax was then applied to the proximal cut surface of the femur and aqua mantis was used to address any bleeding around the femoral neck cut.  The femoral head was examined and found to have large areas of full thickness cartilage loss along the superior and inferomedial head. The labrum had a large complex tear at the anterolateral corner. Retractors were then placed around the acetabulum to  fully visualize the joint space, and the remaining labral tissue was removed and pulvinar was removed.   The acetabulum was then sequentially reamed up to the appropriate size in order to get good fit and fill for  the acetabular component while under fluoroscopic guidance.  Acetabular component was then placed and malleted into a secure fit while confirming position and abduction angle and anteversion utilizing fluoroscopy.  2 screws were then placed in the acetabular cup to assist in securing the cup in place. The cup was irrigated,  a real neutral liner was placed, impacted, and checked for stability. The femur traction was dropped and sequentially externally rotated while performing a release of the posterior and superomedial tissues off of the proximal femur to allow for mobility, care was taken to preserve the external rotators and piriformis attachments.  The remaining interval between the abductors and the capsule was dissected out and a retractor was placed over the superolateral aspect of the femur over the greater trochanter.  The leg was carefully brought down into extension and adducted to provide visualization of the proximal femur for broaching.  The femur was then sequentially broached up to an appropriate size which provided for good fill and stability to the femoral broach.  A trial neck and head were placed on the femoral broach and the leg was brought up for reduction.  The hip was reduced and manual check of stability was performed.  The hip was found to be stable in flexion internal rotation and extension external rotation.  Leg lengths were confirmed on fluoroscopy.   The hip was then dislocated the trial neck and head were removed.  The leg was then brought down into extension and adduction in the proximal femur was reexposed.  The broach trial was removed and the femur was irrigated with normal saline prior to the real femoral stem being implanted.  After the femoral stem was seated and shown to have good fit and fill the appropriate head was impacted the leg was brought up and reduced.  There was good range of motion with stability in flexion internal rotation and extension external rotation on  testing.  Leg lengths were found to be appropriate on fluoroscopic evaluation at this time.  The hip was then irrigated with betdine based surgiphor solution and then saline solution.  The capsulotomy was repaired with Ethibond sutures.  A pericapsular and peritrochanteric cocktail with Exparel and bupivacaine was then injected as well as the subcutaneous tissues. The fascia was closed with a #1 barbed running suture.  The deep tissues were closed with Vicryl sutures the subcutaneous tissues were closed with interrupted Vicryl sutures and a running barbed 4-0 suture.  The skin was then reinforced with Dermabond and a sterile dressing was placed.   The patient was awoken from anesthesia transferred off of the operating room table onto a hospital bed where examination of leg lengths found the leg lengths to be equal with a good distal pulse.  The patient was then transferred to the PACU in stable condition.

## 2023-09-20 NOTE — Anesthesia Postprocedure Evaluation (Signed)
 Anesthesia Post Note  Patient: BLAYNE GARLICK  Procedure(s) Performed: ARTHROPLASTY, HIP, TOTAL, ANTERIOR APPROACH (Right: Hip)  Patient location during evaluation: PACU Anesthesia Type: Spinal Level of consciousness: awake and alert Pain management: pain level controlled Vital Signs Assessment: post-procedure vital signs reviewed and stable Respiratory status: spontaneous breathing, nonlabored ventilation and respiratory function stable Cardiovascular status: blood pressure returned to baseline and stable Postop Assessment: no apparent nausea or vomiting Anesthetic complications: no   No notable events documented.   Last Vitals:  Vitals:   09/20/23 1145 09/20/23 1204  BP: 99/72 110/68  Pulse: 69 73  Resp: 20 17  Temp: (!) 36.1 C 36.6 C  SpO2: 98% 96%    Last Pain:  Vitals:   09/20/23 1249  TempSrc:   PainSc: 8                  Foye Deer

## 2023-09-20 NOTE — Anesthesia Procedure Notes (Signed)
 Spinal  Start time: 09/20/2023 7:35 AM End time: 09/20/2023 7:42 AM Staffing Performed: anesthesiologist  Anesthesiologist: Foye Deer, MD Performed by: Lanell Matar, CRNA Authorized by: Foye Deer, MD   Preanesthetic Checklist Completed: patient identified, IV checked, site marked, risks and benefits discussed, surgical consent, monitors and equipment checked, pre-op evaluation and timeout performed Spinal Block Patient position: sitting Prep: ChloraPrep Patient monitoring: heart rate, continuous pulse ox and blood pressure Approach: midline Location: L3-4 Injection technique: single-shot Needle Needle type: Pencan  Needle gauge: 24 G Needle length: 10 cm Needle insertion depth: 8 cm Assessment Sensory level: T10

## 2023-09-20 NOTE — Transfer of Care (Signed)
 Immediate Anesthesia Transfer of Care Note  Patient: Brooke Molina  Procedure(s) Performed: ARTHROPLASTY, HIP, TOTAL, ANTERIOR APPROACH (Right: Hip)  Patient Location: PACU  Anesthesia Type:Spinal  Level of Consciousness: awake, drowsy, and patient cooperative  Airway & Oxygen Therapy: Patient Spontanous Breathing and Patient connected to face mask oxygen  Post-op Assessment: Report given to RN, Post -op Vital signs reviewed and stable, and Patient moving all extremities X 4  Post vital signs: Reviewed and stable  Last Vitals:  Vitals Value Taken Time  BP 102/61 09/20/23 0950  Temp    Pulse 95 09/20/23 0952  Resp 18 09/20/23 0952  SpO2 99 % 09/20/23 0952  Vitals shown include unfiled device data.  Last Pain:  Vitals:   09/20/23 0620  PainSc: 5       Patients Stated Pain Goal: 0 (09/20/23 1610)  Complications: No notable events documented.

## 2023-09-20 NOTE — Evaluation (Signed)
 Physical Therapy Evaluation Patient Details Name: Brooke Molina MRN: [id number] DOB: 1976-01-28 Today's Date: 09/20/2023  History of Present Illness  Pt is a 48 y/o F with s/p elective R THA with anterior approach. PMH includes: Castleman Disease, OA, Anxiety, Obesity, R TKA, and L TKA.  Clinical Impression  Pt received in Semi-Fowler's position and agreeable to therapy.  Pt performed all mobility training with good technique and had generalized idea of what to expect due to having two prior surgeries on B knees.  Pt overall with reduction in pain and good mobility as she was able to ambulate down to the stairs and then back to the room.  Pt participated in stair training as well and was able to tolerate that without any complication.  Pt then ambulated back to room where she was left with all needs met and call bell within reach.          If plan is discharge home, recommend the following: A little help with walking and/or transfers;A little help with bathing/dressing/bathroom;Help with stairs or ramp for entrance;Assist for transportation   Can travel by private vehicle        Equipment Recommendations Rolling walker (2 wheels);BSC/3in1  Recommendations for Other Services       Functional Status Assessment Patient has had a recent decline in their functional status and demonstrates the ability to make significant improvements in function in a reasonable and predictable amount of time.     Precautions / Restrictions Precautions Precautions: Anterior Hip Precaution Booklet Issued: Yes (comment) Restrictions Weight Bearing Restrictions Per Provider Order: Yes RLE Weight Bearing Per Provider Order: Weight bearing as tolerated      Mobility  Bed Mobility Overal bed mobility: Modified Independent             General bed mobility comments: increased time to perform.    Transfers Overall transfer level: Modified independent Equipment used: Rolling walker (2 wheels)                General transfer comment: Pt with good mobility and safe transfers.    Ambulation/Gait Ambulation/Gait assistance: Supervision, Contact guard assist Gait Distance (Feet): 220 Feet Assistive device: Rolling walker (2 wheels) Gait Pattern/deviations: Step-through pattern, Decreased step length - left, Decreased stance time - right Gait velocity: decreased     General Gait Details: Pt with good technique utilizing the walker.  Stairs Stairs: Yes Stairs assistance: Contact guard assist Stair Management: Two rails, Step to pattern, Forwards Number of Stairs: 4 General stair comments: Good technique and carry-over following verbal cuing.  Wheelchair Mobility     Tilt Bed    Modified Rankin (Stroke Patients Only)       Balance Overall balance assessment: Mild deficits observed, not formally tested                                           Pertinent Vitals/Pain Pain Assessment Pain Assessment: 0-10 Pain Score: 4  Pain Location: R hip/thigh Pain Descriptors / Indicators: Aching Pain Intervention(s): Limited activity within patient's tolerance, Monitored during session, Premedicated before session, Repositioned    Home Living Family/patient expects to be discharged to:: Private residence Living Arrangements: Spouse/significant other;Children Available Help at Discharge: Family;Available PRN/intermittently Type of Home: House Home Access: Stairs to enter Entrance Stairs-Rails: Right;Left;Can reach both Entrance Stairs-Number of Steps: 3   Home Layout: One level Home Equipment: Gilmer Mor -  single point;Hand held shower head      Prior Function Prior Level of Function : Independent/Modified Independent;Working/employed;Driving                     Extremity/Trunk Assessment   Upper Extremity Assessment Upper Extremity Assessment: Overall WFL for tasks assessed    Lower Extremity Assessment Lower Extremity Assessment: RLE  deficits/detail RLE Deficits / Details: weakness s/p hip surgery.       Communication        Cognition Arousal: Alert Behavior During Therapy: WFL for tasks assessed/performed   PT - Cognitive impairments: No apparent impairments                                 Cueing       General Comments      Exercises     Assessment/Plan    PT Assessment Patient needs continued PT services  PT Problem List Decreased strength;Decreased range of motion;Decreased activity tolerance;Decreased balance;Decreased mobility;Decreased knowledge of use of DME;Pain       PT Treatment Interventions DME instruction;Gait training;Stair training;Functional mobility training;Therapeutic activities;Therapeutic exercise;Balance training;Neuromuscular re-education    PT Goals (Current goals can be found in the Care Plan section)  Acute Rehab PT Goals Patient Stated Goal: to go back home tomorrow afternoon. PT Goal Formulation: With patient Time For Goal Achievement: 10/04/23 Potential to Achieve Goals: Good    Frequency BID     Co-evaluation               AM-PAC PT "6 Clicks" Mobility  Outcome Measure Help needed turning from your back to your side while in a flat bed without using bedrails?: A Little Help needed moving from lying on your back to sitting on the side of a flat bed without using bedrails?: A Little Help needed moving to and from a bed to a chair (including a wheelchair)?: A Little Help needed standing up from a chair using your arms (e.g., wheelchair or bedside chair)?: A Little Help needed to walk in hospital room?: A Little Help needed climbing 3-5 steps with a railing? : A Little 6 Click Score: 18    End of Session Equipment Utilized During Treatment: Gait belt Activity Tolerance: Patient tolerated treatment well Patient left: in bed;with call bell/phone within reach Nurse Communication: Mobility status PT Visit Diagnosis: Unsteadiness on feet  (R26.81);Muscle weakness (generalized) (M62.81);Other abnormalities of gait and mobility (R26.89);Difficulty in walking, not elsewhere classified (R26.2);Pain Pain - Right/Left: Right Pain - part of body: Hip    Time: 1610-9604 PT Time Calculation (min) (ACUTE ONLY): 19 min   Charges:   PT Evaluation $PT Eval Low Complexity: 1 Low PT Treatments $Therapeutic Activity: 8-22 mins PT General Charges $$ ACUTE PT VISIT: 1 Visit         Nolon Bussing, PT, DPT Physical Therapist - [facility]  09/20/23, 6:21 PM

## 2023-09-20 NOTE — Discharge Instructions (Signed)

## 2023-09-20 NOTE — Progress Notes (Signed)
 Patient is not able to walk the distance required to go the bathroom, or he/she is unable to safely negotiate stairs required to access the bathroom.  A 3in1 BSC will alleviate this problem

## 2023-09-20 NOTE — Discharge Summary (Signed)
 Physician Discharge Summary  Patient ID: Brooke Molina MRN: [id number] DOB/AGE: 1975-11-15 48 y.o.  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 adult who was admitted 09/20/2023 with a diagnosis of S/P total right hip arthroplasty and went to the operating room on 09/20/2023 and underwent the above named procedures.    Surgeries: Procedure(s): ARTHROPLASTY, HIP, TOTAL, ANTERIOR APPROACH on 09/20/2023 Patient tolerated the surgery well. Taken to PACU where she was stabilized and then transferred to the orthopedic floor.  Started on Lovenox 40 mg q 24 hrs. TEDs and SCDs applied bilaterally. Heels elevated on bed. No evidence of DVT. Negative Homan. Physical therapy started on day #1 for gait training and transfer. OT started day #1 for ADL and assisted devices.  Patient's IV was d/c on day #1. Patient was able to safely and independently complete all PT goals. PT recommending discharge to home.    On post op day #1 patient was stable and ready for discharge to home  with HHPT.  Implants:  Cup: Trident Tritanium clusterhole 48/D w/ x2 screws    Liner: Neutral X3 poly 36/D  Stem: Insignia #4 std offset  Head:Biolox ceramic 36 +65mm   He was given perioperative antibiotics:  Anti-infectives (From admission, onward)    Start     Dose/Rate Route Frequency Ordered Stop   09/20/23 1400  ceFAZolin (ANCEF) IVPB 2g/100 mL premix        2 g 200 mL/hr over 30 Minutes Intravenous Every 6 hours 09/20/23 1019 09/21/23 0159   09/20/23 0630  ceFAZolin (ANCEF) IVPB 2g/100 mL premix        2 g 200 mL/hr over 30 Minutes Intravenous On call to O.R. 09/20/23 4098 09/20/23 0750     .  He was given sequential compression devices, early ambulation, and *** for DVT prophylaxis.  He benefited maximally from the hospital stay and there were no complications.    Recent vital signs:  Vitals:   09/20/23 1145 09/20/23 1204  BP: 99/72 110/68  Pulse: 69 73  Resp: 20 17  Temp: (!) 97 F (36.1 C) 97.9 F (36.6 C)  SpO2: 98% 96%    Recent laboratory studies:  Lab Results  Component Value Date   HGB 13.2 07/04/2022   HGB 10.8 (L) 08/10/2021   HGB 13.3 08/09/2021   Lab Results  Component Value Date   WBC 9.2 07/04/2022   PLT 286 07/04/2022   No results found for: "INR" Lab Results  Component Value Date  NA 137 07/04/2022   K 3.7 07/04/2022   CL 107 07/04/2022   CO2 23 07/04/2022   BUN 8 07/04/2022   CREATININE 0.59 07/04/2022   GLUCOSE 102 (H) 07/04/2022    Discharge Medications:   Allergies as of 09/20/2023   No Known Allergies      Medication List     TAKE these medications    acetaminophen 500 MG tablet Commonly known as: TYLENOL Take 2 tablets (1,000 mg total) by mouth every 8 (eight) hours.   celecoxib 200 MG capsule Commonly known as: CELEBREX Take 200 mg by mouth 2 (two) times daily.   cyclobenzaprine 5 MG tablet Commonly known as: FLEXERIL Take 5 mg by mouth 2 (two) times daily as needed (spasms/pain.).   docusate sodium 100 MG  capsule Commonly known as: COLACE Take 1 capsule (100 mg total) by mouth 2 (two) times daily.   Dupilumab 300 MG/2ML Soaj Inject 300 mg into the skin every 14 (fourteen) days.   enoxaparin 40 MG/0.4ML injection Commonly known as: LOVENOX Inject 0.4 mLs (40 mg total) into the skin daily for 14 days. Start taking on: September 21, 2023   gabapentin 300 MG capsule Commonly known as: NEURONTIN Take 300 mg by mouth 2 (two) times daily.   hydroxychloroquine 200 MG tablet Commonly known as: PLAQUENIL Take 200 mg by mouth 2 (two) times daily.   oxyCODONE 5 MG immediate release tablet Commonly known as: Roxicodone Take 1 tablet (5 mg total) by mouth every 6 (six) hours as needed for breakthrough pain.   SYLVANT IV Inject 1 Dose into the vein every 21 ( twenty-one) days.   traMADol 50 MG tablet Commonly known as: ULTRAM Take 1 tablet (50 mg total) by mouth every 6 (six) hours as needed for moderate pain (pain score 4-6).   traZODone 150 MG tablet Commonly known as: DESYREL Take 150 mg by mouth at bedtime.   Trelegy Ellipta 100-62.5-25 MCG/ACT Aepb Generic drug: Fluticasone-Umeclidin-Vilant Inhale 1 puff into the lungs in the morning.               Durable Medical Equipment  (From admission, onward)           Start     Ordered   09/20/23 1238  For home use only DME 3 n 1  Once        09/20/23 1237   09/20/23 1238  For home use only DME Walker  Once       Question:  Patient needs a walker to treat with the following condition  Answer:  S/P total right hip arthroplasty   09/20/23 1238            Diagnostic Studies: DG HIP UNILAT WITH PELVIS 2-3 VIEWS RIGHT Result Date: 09/20/2023 CLINICAL DATA:  Elective surgery. EXAM: DG HIP (WITH OR WITHOUT PELVIS) 2-3V RIGHT COMPARISON:  None Available. FINDINGS: Four fluoroscopic spot views of the pelvis and right hip obtained in the operating room. Images during hip arthroplasty. Fluoroscopy time 17 seconds. Dose 2.1949 mGy.  IMPRESSION: Intraoperative fluoroscopy during right hip arthroplasty. Electronically Signed   By: Narda Rutherford M.D.   On: 09/20/2023 09:51   DG C-Arm 1-60 Min-No Report Result Date: 09/20/2023 Fluoroscopy was utilized by the requesting physician.  No radiographic interpretation.   DG C-Arm 1-60 Min-No Report Result Date: 09/20/2023 Fluoroscopy was utilized by the requesting physician.  No radiographic interpretation.    Disposition:      Follow-up Information     Evon Slack,  PA-C Follow up in 2 week(s).   Specialties: Orthopedic Surgery, Emergency Medicine Contact information: [address] [place] [address] [phone]                  Signed: Evon Slack 09/20/2023, 12:44 PM

## 2023-09-20 NOTE — Plan of Care (Signed)
 Continue

## 2023-09-20 NOTE — H&P (Signed)
 History of Present Illness: Brooke Molina is an 48 y.o. female who presents for follow-up evaluation of her right hip prior to planned right total hip replacement. Patient had ongoing right groin pain that radiates to her lateral hip and is causing severe discomfort limiting her activities of daily living off her socks and shoes and getting in and out of the car. Reports pain at rest up to about a 7 out of 10 and up to about a 9 out of 10 with activity. She reports this pain continues despite treatments including gabapentin walks, ice, physical therapy, and hip steroid injections which gave short-term and no long-lasting relief. She has been working with physical therapy and exercising and strength but is still limited on a daily basis. The patient denies fevers, chills, numbness, tingling, shortness of breath, chest pain, recent illness, or any trauma.  Patient has an A1c 5.3 a BMI of 33 and has maintained cessation  Past Medical History: Past Medical History:  Diagnosis Date  Anxiety, generalized 09/11/2018  Bronchitis, chronic (CMS/HHS-HCC) 9/23  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CMS/HHS-HCC)  COPD (chronic obstructive pulmonary disease) (CMS/HHS-HCC) 12/23  Was believed after 4 months of getting bronchitis  Dermatitis 2022  Ezcema  Eczema of lower leg  Obesity  Primary osteoarthritis of both knees  Tobacco use   Past Surgical History: Past Surgical History:  Procedure Laterality Date  ear tubes 1980  TONSILLECTOMY 1980  TUBAL LIGATION 1998  lymph node removal Left 2013  left cervical lymph node  mole biopsy 08/2020  REPLACEMENT TOTAL KNEE Right 08/09/2021  By Dr. Rosita Kea  JOINT REPLACEMENT 09/2019  Left knee   Past Family History: Family History  Problem Relation Age of Onset  Atrial fibrillation (Abnormal heart rhythm sometimes requiring treatment with blood thinners) Mother  Epilepsy Mother  Arthritis Father  Endometriosis Sister  Breast cancer Maternal Aunt  Rheum arthritis  Maternal Aunt  COPD Maternal Uncle  Osteoarthritis Maternal Grandmother  Coronary Artery Disease (Blocked arteries around heart) Maternal Grandfather  Osteoarthritis Maternal Grandfather  Heart disease Maternal Grandfather  Cancer Maternal Grandfather  Many people in family with different types of cancer  Osteoarthritis Paternal Grandmother  Osteoarthritis Paternal Grandfather   Medications: Current Outpatient Medications  Medication Sig Dispense Refill  cyclobenzaprine (FLEXERIL) 5 MG tablet Take 5 mg by mouth  albuterol MDI, PROVENTIL, VENTOLIN, PROAIR, HFA 90 mcg/actuation inhaler Inhale 2 inhalations into the lungs every 6 (six) hours as needed for Wheezing 44 g 3  celecoxib (CELEBREX) 200 MG capsule Take 1 capsule (200 mg total) by mouth 2 (two) times daily 180 capsule 1  dextrose 5 % SolP 250 mL with siltuximab 100 mg SolR 11 mg/kg chemo infusion Inject into the vein Every 3 weeks  diclofenac (VOLTAREN) 75 MG EC tablet Take 75 mg by mouth 2 (two) times daily with meals (Patient not taking: Reported on 08/29/2023)  DULoxetine (CYMBALTA) 30 MG DR capsule Take 1 capsule (30 mg total) by mouth once daily 30 capsule 11  dupilumab (DUPIXENT) 300 mg/2 mL pen injector Inject 2 mLs (300 mg total) subcutaneously every 14 (fourteen) days 4 mL 11  EPINEPHrine (EPIPEN) 0.3 mg/0.3 mL auto-injector Inject 0.3 mg into the muscle once  ferrous sulfate 325 (65 FE) MG EC tablet Take 1 tablet (325 mg total) by mouth daily with breakfast 30 tablet 11  fluticasone-umeclidinium-vilanterol (TRELEGY ELLIPTA) 100-62.5-25 mcg inhaler Inhale 1 Puff into the lungs once daily 180 each 3  gabapentin (NEURONTIN) 300 MG capsule Take 300 mg by mouth 3 (three) times daily  halobetasol (ULTRAVATE) 0.05 % ointment Apply topically 2 (two) times daily  hydroxychloroquine (PLAQUENIL) 200 mg tablet Take 1 tablet (200 mg total) by mouth 2 (two) times daily 180 tablet 1  lidocaine-prilocaine (EMLA) cream Apply topically as  needed  siltuximab (SYLVANT) 400 mg injection Inject 11 mg/kg into the vein every 21 (twenty-one) days Every 3 weeks  traZODone (DESYREL) 150 MG tablet Take 1 tablet (150 mg total) by mouth at bedtime for 180 days 90 tablet 1   No current facility-administered medications for this visit.   Allergies: No Known Allergies   Visit Vitals: There were no vitals filed for this visit.   Review of Systems:  A comprehensive 14 point ROS was performed, reviewed, and the pertinent orthopaedic findings are documented in the HPI.  Physical Exam: There is no height or weight on file to calculate BMI. General/Constitutional: No apparent distress: well-nourished and well developed. Lymphatic: No palpable adenopathy. Pulmonary exam: Lungs clear to auscultation bilaterally no wheezing rales or rhonchi Cardiac exam: Regular rate and rhythm no obvious murmurs rubs or gallops. Vascular: No edema, swelling or tenderness, except as noted in detailed exam. Integumentary: No impressive skin lesions present, except as noted in detailed exam. Neuro/Psych: Normal mood and affect, oriented to person, place and time. Musculoskeletal: Normal, except as noted in detailed exam and in HPI. Right hip exam  SKIN: intact SWELLING: none WARMTH: no warmth TENDERNESS: Mild tenderness over the hip does not reproduce her pain, Stinchfield Positive ROM: 10 degrees internal rotation and 40 degrees external rotation and pain with internal rotation, which localizes to the groin; Hip Flexion 90 STRENGTH: normal GAIT: antalgic and stiff-legged STABILITY: stable to testing CREPITUS: no LEG LENGTH DISCREPANCY: none NEUROLOGICAL EXAM: normal VASCULAR EXAM: normal LUMBAR SPINE: tenderness: no straight leg raising sign: no motor exam: normal  The contralateral hip was examined for comparison and it showed: TENDERNESS: none ROM: normal and full STRENGTH: normal STABILITY: stable to testing  Hip Imaging :  I reviewed AP  pelvis and lateral right hip x-rays performed on 08/01/2023 images reviewed by myself. There is demonstration of medial and inferior joint space narrowing which is worsened from prior films with morphologic changes to the posterior and inferior femoral head with cystic changes, sclerosis and cam deformity. There is also increased sclerosis and osteophyte formation along the acetabulum compared to prior films. Moderate degeneration of the right hip no fractures or dislocations noted.    Narrative  CLINICAL DATA: Right hip and groin pain for 2 months. No known injury.  EXAM: MR OF THE RIGHT HIP WITHOUT CONTRAST  TECHNIQUE: Multiplanar, multisequence MR imaging was performed. No intravenous contrast was administered.  COMPARISON: None Available.  FINDINGS: Bones: No fracture, stress change or worrisome lesion is identified. No avascular necrosis of the femoral heads. Minimal subchondral edema is seen in the periphery of the right acetabulum. Negative for subchondral cyst formation.  Articular cartilage and labrum  Articular cartilage: Mildly thinned and frayed without focal defect.  Labrum: There is a paralabral cyst measuring 1.8 cm craniocaudal by 0.7 cm transverse by 1.6 cm AP centered at approximately the 12 o'clock position of the acetabulum indicative of a labral tear. The labral tear is difficult to visualize without intra-articular contrast but there appears to be a defect in the labrum best seen on image 16 of series 6.  Joint or bursal effusion  Joint effusion: None.  Bursae: There is a small volume of fluid in the right trochanteric bursa.  Muscles and tendons  Muscles  and tendons: Intact. There is some fluid about the distal right gluteus minimus tendon.  Other findings  Miscellaneous: Fibroid uterus is identified with fibroids measuring up to approximately 4 cm in diameter.  IMPRESSION: 1. Mild appearing right hip osteoarthritis. 2. Paralabral cyst  centered at approximately the 12 o'clock position of the right acetabulum indicative of a labral tear. 3. Mild right trochanteric bursitis and peritendinitis about the distal right gluteus minimus tendon. 4. Fibroid uterus.  Electronically Signed By: Drusilla Kanner M.D. On: 10/23/2022 09:02  Assessment:  Right hip osteoarthritis, right hip labral tear  Plan: Brooke Molina is a 48 year old female who presents with ongoing severe right hip pain and functional limitations secondary to her right hip. We reviewed conservative treatment options and discussed trying another injection but given her lack of response from her first shot she is hesitant to try any other conservative treatments. Her x-ray has worsened today and she is limiting her participation in activity secondary to the hip. She is interested in trying to move forward with surgical intervention again as her hip has worsened both clinically and on radiographs today specifically with some morphologic changes and cyst formation within the femoral head. Based upon the patient's continued symptoms and failure to respond to conservative treatment, I have recommended a right total hip replacement for this patient. A long discussion took place with the patient describing what a total joint replacement is and what the procedure would entail. A hip model, similar to the implants that will be used during the operation, was utilized to demonstrate the implants. Choices of implant manufactures were discussed and reviewed. The ability to secure the implant utilizing cement or cementless (press fit) fixation was discussed. Anterior and posterior exposures were discussed. For this patient an appropriate approach will be anterior.   The hospitalization and post-operative care and rehabilitation were also discussed. The use of perioperative antibiotics and DVT prophylaxis were discussed. The risk, benefits and alternatives to a surgical intervention were discussed  at length with the patient. The patient was also advised of risks related to the medical comorbidities and elevated body mass index (BMI). A lengthy discussion took place to review the most common complications including but not limited to: deep vein thrombosis, pulmonary embolus, heart attack, stroke, infection, wound breakdown, heterotopic ossification, dislocation, numbness, leg length in-equality, intraoperative fracture, damage to nerves, tendon,muscles, arteries or other blood vessels, death and other possible complications from anesthesia. The patient was told that we will take steps to minimize these risks by using sterile technique, antibiotics and DVT prophylaxis when appropriate and follow the patient postoperatively in the office setting to monitor progress. The possibility of recurrent pain, no improvement in pain and actual worsening of pain were also discussed with the patient. The risk of dislocation following total hip replacement was discussed and potential precautions to prevent dislocation were reviewed. We had a specific conversation about her young age and increased risk of need for revision during her life.  Patient asked about and confirms no history of any reactions to metal or metal allergy in the past.  The discharge plan of care focused on the patient going home following surgery. The patient was encouraged to make the necessary arrangements to have someone stay with them when they are discharged home.   The benefits of surgery were discussed with the patient including the potential for improving the patient's current clinical condition through operative intervention. Alternatives to surgical intervention including continued conservative management were also discussed in detail. All questions were answered  to the satisfaction of the patient. The patient participated and agreed to the plan of care as well as the use of the recommended implants for their total hip replacement surgery.  An information packet was given to the patient to review prior to surgery.   Patient received medical clearance for surgery. All questions answered patient agrees above plan make preparations for a right anterior total replacement.   Portions of this record have been created using Scientist, clinical (histocompatibility and immunogenetics). Dictation errors have been sought, but may not have been identified and corrected.  Reinaldo Berber MD

## 2023-09-20 NOTE — Interval H&P Note (Signed)
 Patient history and physical updated. Consent reviewed including risks, benefits, and alternatives to surgery. Patient agrees with above plan to proceed with right anterior total hip arthroplasty.

## 2023-09-21 ENCOUNTER — Encounter: Payer: Self-pay | Admitting: Orthopedic Surgery

## 2023-09-21 DIAGNOSIS — M1611 Unilateral primary osteoarthritis, right hip: Secondary | ICD-10-CM | POA: Diagnosis not present

## 2023-09-21 LAB — BASIC METABOLIC PANEL
Anion gap: 6 (ref 5–15)
BUN: 8 mg/dL (ref 6–20)
CO2: 24 mmol/L (ref 22–32)
Calcium: 8.3 mg/dL — ABNORMAL LOW (ref 8.9–10.3)
Chloride: 108 mmol/L (ref 98–111)
Creatinine, Ser: 0.63 mg/dL (ref 0.44–1.00)
GFR, Estimated: >60 mL/min (ref >60)
Glucose, Bld: 98 mg/dL (ref 70–99)
Potassium: 3.6 mmol/L (ref 3.5–5.1)
Sodium: 138 mmol/L (ref 135–145)

## 2023-09-21 LAB — CBC
HCT: 32.1 % — ABNORMAL LOW (ref 36.0–46.0)
Hemoglobin: 11.1 g/dL — ABNORMAL LOW (ref 12.0–15.0)
MCH: 32.4 pg (ref 26.0–34.0)
MCHC: 34.6 g/dL (ref 30.0–36.0)
MCV: 93.6 fL (ref 80.0–100.0)
Platelets: 141 10*3/uL — ABNORMAL LOW (ref 150–400)
RBC: 3.43 MIL/uL — ABNORMAL LOW (ref 3.87–5.11)
RDW: 12.9 % (ref 11.5–15.5)
WBC: 12.2 10*3/uL — ABNORMAL HIGH (ref 4.0–10.5)
nRBC: 0 % (ref 0.0–0.2)

## 2023-09-21 MED ORDER — SODIUM CHLORIDE 0.9 % IV BOLUS
500.0000 mL | Freq: Once | INTRAVENOUS | Status: AC
  Administered 2023-09-21: 500 mL via INTRAVENOUS

## 2023-09-21 MED ORDER — GABAPENTIN 300 MG PO CAPS
ORAL_CAPSULE | ORAL | Status: AC
  Filled 2023-09-21: qty 1

## 2023-09-21 MED ORDER — ACETAMINOPHEN 500 MG PO TABS
ORAL_TABLET | ORAL | Status: AC
  Filled 2023-09-21: qty 2

## 2023-09-21 NOTE — Progress Notes (Signed)
   Subjective: 1 Day Post-Op Procedure(s) (LRB): ARTHROPLASTY, HIP, TOTAL, ANTERIOR APPROACH (Right) Patient reports pain as mild.   Patient is well, and has had no acute complaints or problems Denies any CP, SOB, ABD pain. We will continue therapy today.  Plan is to go Home after hospital stay.  Objective: Vital signs in last 24 hours: Temp:  [97 F (36.1 C)-98.5 F (36.9 C)] 98.1 F (36.7 C) (03/21 0357) Pulse Rate:  [64-89] 67 (03/21 0423) Resp:  [15-28] 16 (03/21 0357) BP: (89-114)/(54-72) 95/55 (03/21 0423) SpO2:  [93 %-100 %] 95 % (03/21 0423) Weight:  [104.9 kg] 104.9 kg (03/20 1042)  Intake/Output from previous day: 03/20 0701 - 03/21 0700 In: 1118.8 [I.V.:1035; IV Piggyback:83.8] Out: 5175 [Urine:4975; Blood:200] Intake/Output this shift: Total I/O In: -  Out: 300 [Urine:300]  Recent Labs    09/21/23 0522  HGB 11.1*   Recent Labs    09/21/23 0522  WBC 12.2*  RBC 3.43*  HCT 32.1*  PLT 141*   Recent Labs    09/21/23 0522  NA 138  K 3.6  CL 108  CO2 24  BUN 8  CREATININE 0.63  GLUCOSE 98  CALCIUM 8.3*   No results for input(s): "LABPT", "INR" in the last 72 hours.  EXAM General - Patient is Alert, Appropriate, and Oriented Extremity - Neurovascular intact Sensation intact distally Intact pulses distally Dorsiflexion/Plantar flexion intact Dressing - dressing C/D/I and no drainage Motor Function - intact, moving foot and toes well on exam.   Past Medical History:  Diagnosis Date   Anxiety    Bronchitis, mucopurulent recurrent (HCC)    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HCC)    COPD with asthma (HCC)    Idiopathic small fiber peripheral neuropathy    Intramural leiomyoma of uterus    Irregular menses    Labral tear of right hip joint    Lung nodule    Neuromuscular disorder (HCC)    Osteoarthritis    bilateral knees   Primary osteoarthritis of right hip     Assessment/Plan:   1 Day Post-Op Procedure(s) (LRB): ARTHROPLASTY, HIP, TOTAL, ANTERIOR  APPROACH (Right) Principal Problem:   S/P total right hip arthroplasty  Estimated body mass index is 33.18 kg/m as calculated from the following:   Height as of this encounter: 5\' 10"  (1.778 m).   Weight as of this encounter: 104.9 kg. Advance diet Up with therapy Pain well controlled Labs are stable VSS, BP soft, give 500 cc bolus NS. Asymptomatic CM to assist with discharge to home with HHPT today  DVT Prophylaxis - Lovenox, TED hose, and SCDs Weight-Bearing as tolerated to right leg   T. Cranston Neighbor, PA-C [facility] 09/21/2023, 7:58 AM

## 2023-09-21 NOTE — Plan of Care (Signed)
 ?  Problem: Clinical Measurements: ?Goal: Respiratory complications will improve ?Outcome: Progressing ?  ?Problem: Activity: ?Goal: Risk for activity intolerance will decrease ?Outcome: Progressing ?  ?Problem: Coping: ?Goal: Level of anxiety will decrease ?Outcome: Progressing ?  ?Problem: Elimination: ?Goal: Will not experience complications related to urinary retention ?Outcome: Progressing ?  ?

## 2023-09-27 ENCOUNTER — Other Ambulatory Visit: Payer: Self-pay | Admitting: Student

## 2023-09-27 DIAGNOSIS — S63501D Unspecified sprain of right wrist, subsequent encounter: Secondary | ICD-10-CM

## 2023-09-27 DIAGNOSIS — S62111D Displaced fracture of triquetrum [cuneiform] bone, right wrist, subsequent encounter for fracture with routine healing: Secondary | ICD-10-CM

## 2023-09-27 DIAGNOSIS — G8929 Other chronic pain: Secondary | ICD-10-CM

## 2023-10-08 ENCOUNTER — Ambulatory Visit
Admission: RE | Admit: 2023-10-08 | Discharge: 2023-10-08 | Disposition: A | Discharge status: Home / Self Care | Source: Ambulatory Visit | Attending: Student | Admitting: Student

## 2023-10-08 ENCOUNTER — Ambulatory Visit
Admission: RE | Admit: 2023-10-08 | Discharge: 2023-10-08 | Disposition: A | Discharge status: Home / Self Care | Source: Ambulatory Visit | Attending: Student

## 2023-10-08 DIAGNOSIS — S62111A Displaced fracture of triquetrum [cuneiform] bone, right wrist, initial encounter for closed fracture: Secondary | ICD-10-CM | POA: Diagnosis not present

## 2023-10-08 DIAGNOSIS — G8929 Other chronic pain: Secondary | ICD-10-CM | POA: Diagnosis present

## 2023-10-08 DIAGNOSIS — S62111D Displaced fracture of triquetrum [cuneiform] bone, right wrist, subsequent encounter for fracture with routine healing: Secondary | ICD-10-CM

## 2023-10-08 DIAGNOSIS — S63501D Unspecified sprain of right wrist, subsequent encounter: Secondary | ICD-10-CM

## 2023-10-08 MED ORDER — SODIUM CHLORIDE (PF) 0.9% IJ SOLUTION - NO CHARGE
20.0000 mL | INTRAMUSCULAR | Status: DC | PRN
  Administered 2023-10-08: 5 mL via INTRAVENOUS
  Filled 2023-10-08: qty 20

## 2023-10-08 MED ORDER — IOHEXOL 180 MG/ML  SOLN
20.0000 mL | Freq: Once | INTRAMUSCULAR | Status: AC | PRN
  Administered 2023-10-08: 15 mL

## 2023-10-08 MED ORDER — GADOBUTROL 1 MMOL/ML IV SOLN
2.0000 mL | Freq: Once | INTRAVENOUS | Status: AC | PRN
  Administered 2023-10-08: 0.05 mL

## 2023-11-05 ENCOUNTER — Other Ambulatory Visit: Payer: Self-pay | Admitting: Pulmonary Disease

## 2023-11-05 DIAGNOSIS — J4489 Other specified chronic obstructive pulmonary disease: Secondary | ICD-10-CM

## 2023-11-05 DIAGNOSIS — D47Z2 Castleman disease: Secondary | ICD-10-CM

## 2023-11-05 DIAGNOSIS — R911 Solitary pulmonary nodule: Secondary | ICD-10-CM

## 2023-11-05 DIAGNOSIS — J411 Mucopurulent chronic bronchitis: Secondary | ICD-10-CM

## 2023-11-15 ENCOUNTER — Ambulatory Visit

## 2023-11-16 ENCOUNTER — Ambulatory Visit
Admission: RE | Admit: 2023-11-16 | Discharge: 2023-11-16 | Disposition: A | Discharge status: Home / Self Care | Source: Ambulatory Visit | Attending: Pulmonary Disease | Admitting: Pulmonary Disease

## 2023-11-16 DIAGNOSIS — D47Z2 Castleman disease: Secondary | ICD-10-CM | POA: Diagnosis present

## 2023-11-16 DIAGNOSIS — J4489 Other specified chronic obstructive pulmonary disease: Secondary | ICD-10-CM | POA: Insufficient documentation

## 2023-11-16 DIAGNOSIS — R911 Solitary pulmonary nodule: Secondary | ICD-10-CM | POA: Insufficient documentation

## 2023-11-16 DIAGNOSIS — J411 Mucopurulent chronic bronchitis: Secondary | ICD-10-CM | POA: Insufficient documentation

## 2023-12-20 ENCOUNTER — Other Ambulatory Visit

## 2023-12-28 ENCOUNTER — Ambulatory Visit: Admit: 2023-12-28 | Admitting: Obstetrics and Gynecology

## 2023-12-28 SURGERY — HYSTERECTOMY, VAGINAL
Anesthesia: General | Site: Uterus | Laterality: Bilateral

## 2024-01-02 ENCOUNTER — Other Ambulatory Visit: Payer: Self-pay | Admitting: Family Medicine

## 2024-01-02 DIAGNOSIS — Z1231 Encounter for screening mammogram for malignant neoplasm of breast: Secondary | ICD-10-CM

## 2024-08-29 ENCOUNTER — Ambulatory Visit: Admit: 2024-08-29 | Admitting: Obstetrics and Gynecology

## 2024-08-29 SURGERY — HYSTERECTOMY, VAGINAL, WITH SALPINGECTOMY
Anesthesia: General | Site: Uterus | Laterality: Bilateral
